# Patient Record
Sex: Female | Born: 1970
Health system: Southern US, Community
[De-identification: ages and names within clinical notes are randomized; demographics above are authoritative.]

## PROBLEM LIST (undated history)

## (undated) DIAGNOSIS — F419 Anxiety disorder, unspecified: Secondary | ICD-10-CM

## (undated) DIAGNOSIS — D6852 Prothrombin gene mutation: Secondary | ICD-10-CM

## (undated) DIAGNOSIS — S83519A Sprain of anterior cruciate ligament of unspecified knee, initial encounter: Secondary | ICD-10-CM

## (undated) DIAGNOSIS — I517 Cardiomegaly: Secondary | ICD-10-CM

## (undated) DIAGNOSIS — D759 Disease of blood and blood-forming organs, unspecified: Secondary | ICD-10-CM

## (undated) DIAGNOSIS — I10 Essential (primary) hypertension: Secondary | ICD-10-CM

## (undated) DIAGNOSIS — E042 Nontoxic multinodular goiter: Secondary | ICD-10-CM

## (undated) DIAGNOSIS — F32A Depression, unspecified: Secondary | ICD-10-CM

## (undated) DIAGNOSIS — G43909 Migraine, unspecified, not intractable, without status migrainosus: Secondary | ICD-10-CM

## (undated) DIAGNOSIS — R0600 Dyspnea, unspecified: Secondary | ICD-10-CM

## (undated) DIAGNOSIS — M545 Low back pain, unspecified: Secondary | ICD-10-CM

## (undated) DIAGNOSIS — M47816 Spondylosis without myelopathy or radiculopathy, lumbar region: Secondary | ICD-10-CM

## (undated) DIAGNOSIS — I2699 Other pulmonary embolism without acute cor pulmonale: Secondary | ICD-10-CM

## (undated) DIAGNOSIS — G4700 Insomnia, unspecified: Secondary | ICD-10-CM

## (undated) DIAGNOSIS — D6851 Activated protein C resistance: Secondary | ICD-10-CM

## (undated) DIAGNOSIS — S14109A Unspecified injury at unspecified level of cervical spinal cord, initial encounter: Secondary | ICD-10-CM

## (undated) DIAGNOSIS — R51 Headache: Secondary | ICD-10-CM

## (undated) DIAGNOSIS — J45909 Unspecified asthma, uncomplicated: Secondary | ICD-10-CM

## (undated) DIAGNOSIS — R296 Repeated falls: Secondary | ICD-10-CM

## (undated) DIAGNOSIS — I82409 Acute embolism and thrombosis of unspecified deep veins of unspecified lower extremity: Secondary | ICD-10-CM

## (undated) DIAGNOSIS — R202 Paresthesia of skin: Secondary | ICD-10-CM

## (undated) DIAGNOSIS — M791 Myalgia, unspecified site: Secondary | ICD-10-CM

## (undated) DIAGNOSIS — J449 Chronic obstructive pulmonary disease, unspecified: Secondary | ICD-10-CM

## (undated) DIAGNOSIS — G950 Syringomyelia and syringobulbia: Secondary | ICD-10-CM

## (undated) DIAGNOSIS — N3010 Interstitial cystitis (chronic) without hematuria: Secondary | ICD-10-CM

## (undated) DIAGNOSIS — F329 Major depressive disorder, single episode, unspecified: Secondary | ICD-10-CM

## (undated) DIAGNOSIS — S83419A Sprain of medial collateral ligament of unspecified knee, initial encounter: Secondary | ICD-10-CM

## (undated) DIAGNOSIS — R200 Anesthesia of skin: Secondary | ICD-10-CM

## (undated) DIAGNOSIS — M797 Fibromyalgia: Secondary | ICD-10-CM

## (undated) DIAGNOSIS — R519 Headache, unspecified: Secondary | ICD-10-CM

## (undated) HISTORY — DX: Unspecified injury at unspecified level of cervical spinal cord, initial encounter: S14.109A

## (undated) HISTORY — DX: Essential (primary) hypertension: I10

## (undated) HISTORY — DX: Other pulmonary embolism without acute cor pulmonale: I26.99

## (undated) HISTORY — DX: Low back pain: M54.5

## (undated) HISTORY — PX: CERVICAL SPINE SURGERY: SHX589

## (undated) HISTORY — DX: Acute embolism and thrombosis of unspecified deep veins of unspecified lower extremity: I82.409

## (undated) HISTORY — DX: Insomnia, unspecified: G47.00

## (undated) HISTORY — DX: Myalgia, unspecified site: M79.10

## (undated) HISTORY — DX: Low back pain, unspecified: M54.50

## (undated) HISTORY — DX: Cardiomegaly: I51.7

## (undated) HISTORY — PX: CHOLECYSTECTOMY: SHX55

## (undated) HISTORY — DX: Chronic obstructive pulmonary disease, unspecified: J44.9

## (undated) HISTORY — DX: Prothrombin gene mutation: D68.52

## (undated) HISTORY — DX: Anxiety disorder, unspecified: F41.9

---

## 1998-05-09 ENCOUNTER — Ambulatory Visit (HOSPITAL_COMMUNITY): Admission: RE | Admit: 1998-05-09 | Discharge: 1998-05-09 | Payer: Self-pay | Admitting: Urology

## 1998-07-18 ENCOUNTER — Inpatient Hospital Stay (HOSPITAL_COMMUNITY): Admission: AD | Admit: 1998-07-18 | Discharge: 1998-07-21 | Payer: Self-pay | Admitting: Emergency Medicine

## 1998-08-14 ENCOUNTER — Ambulatory Visit (HOSPITAL_COMMUNITY): Admission: RE | Admit: 1998-08-14 | Discharge: 1998-08-14 | Payer: Self-pay | Admitting: Gastroenterology

## 1998-08-22 ENCOUNTER — Ambulatory Visit (HOSPITAL_COMMUNITY): Admission: RE | Admit: 1998-08-22 | Discharge: 1998-08-22 | Payer: Self-pay | Admitting: Gastroenterology

## 1998-08-22 ENCOUNTER — Encounter: Payer: Self-pay | Admitting: Gastroenterology

## 2013-07-13 DIAGNOSIS — S83419A Sprain of medial collateral ligament of unspecified knee, initial encounter: Secondary | ICD-10-CM

## 2013-07-13 DIAGNOSIS — S83519A Sprain of anterior cruciate ligament of unspecified knee, initial encounter: Secondary | ICD-10-CM

## 2013-07-13 HISTORY — DX: Sprain of medial collateral ligament of unspecified knee, initial encounter: S83.419A

## 2013-07-13 HISTORY — DX: Sprain of anterior cruciate ligament of unspecified knee, initial encounter: S83.519A

## 2014-01-17 DIAGNOSIS — S83519A Sprain of anterior cruciate ligament of unspecified knee, initial encounter: Secondary | ICD-10-CM | POA: Insufficient documentation

## 2014-01-17 DIAGNOSIS — S83419A Sprain of medial collateral ligament of unspecified knee, initial encounter: Secondary | ICD-10-CM | POA: Insufficient documentation

## 2014-05-09 DIAGNOSIS — S83421A Sprain of lateral collateral ligament of right knee, initial encounter: Secondary | ICD-10-CM | POA: Insufficient documentation

## 2014-06-06 ENCOUNTER — Ambulatory Visit (INDEPENDENT_AMBULATORY_CARE_PROVIDER_SITE_OTHER): Payer: BC Managed Care – HMO | Admitting: Diagnostic Neuroimaging

## 2014-06-06 ENCOUNTER — Encounter: Payer: Self-pay | Admitting: Diagnostic Neuroimaging

## 2014-06-06 VITALS — BP 117/88 | HR 82 | Temp 98.2°F | Ht 64.0 in | Wt 264.5 lb

## 2014-06-06 DIAGNOSIS — G950 Syringomyelia and syringobulbia: Secondary | ICD-10-CM
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
## 2014-06-10 ENCOUNTER — Encounter: Payer: Self-pay | Admitting: Diagnostic Neuroimaging

## 2014-07-20 ENCOUNTER — Encounter: Payer: Self-pay | Admitting: Diagnostic Neuroimaging

## 2014-09-28 DIAGNOSIS — G950 Syringomyelia and syringobulbia: Secondary | ICD-10-CM | POA: Insufficient documentation

## 2014-09-28 DIAGNOSIS — M62838 Other muscle spasm: Secondary | ICD-10-CM | POA: Insufficient documentation

## 2014-09-28 DIAGNOSIS — R569 Unspecified convulsions: Secondary | ICD-10-CM | POA: Insufficient documentation

## 2014-11-29 DIAGNOSIS — R202 Paresthesia of skin: Secondary | ICD-10-CM | POA: Insufficient documentation

## 2014-11-29 DIAGNOSIS — R200 Anesthesia of skin: Secondary | ICD-10-CM | POA: Insufficient documentation

## 2014-12-05 ENCOUNTER — Ambulatory Visit: Payer: BC Managed Care – HMO | Admitting: Diagnostic Neuroimaging

## 2015-05-02 ENCOUNTER — Other Ambulatory Visit: Payer: Self-pay | Admitting: Gastroenterology

## 2015-05-02 DIAGNOSIS — R110 Nausea: Secondary | ICD-10-CM

## 2015-05-02 DIAGNOSIS — R1011 Right upper quadrant pain: Secondary | ICD-10-CM

## 2015-05-07 ENCOUNTER — Ambulatory Visit (HOSPITAL_COMMUNITY): Admission: RE | Admit: 2015-05-07 | Payer: BLUE CROSS/BLUE SHIELD | Source: Ambulatory Visit

## 2015-05-08 ENCOUNTER — Ambulatory Visit: Payer: Self-pay | Admitting: Gastroenterology

## 2015-05-09 ENCOUNTER — Ambulatory Visit (HOSPITAL_COMMUNITY): Payer: BLUE CROSS/BLUE SHIELD

## 2015-10-04 ENCOUNTER — Ambulatory Visit (INDEPENDENT_AMBULATORY_CARE_PROVIDER_SITE_OTHER): Payer: BLUE CROSS/BLUE SHIELD | Admitting: Obstetrics & Gynecology

## 2015-10-04 ENCOUNTER — Encounter: Payer: Self-pay | Admitting: Obstetrics & Gynecology

## 2015-10-04 VITALS — BP 136/85 | HR 85 | Temp 98.7°F | Wt 264.0 lb

## 2015-10-04 DIAGNOSIS — N938 Other specified abnormal uterine and vaginal bleeding: Secondary | ICD-10-CM | POA: Insufficient documentation

## 2015-10-04 LAB — CBC
HCT: 37.3 % (ref 36.0–46.0)
Hemoglobin: 12.3 g/dL (ref 12.0–15.0)
MCH: 28.9 pg (ref 26.0–34.0)
MCHC: 33 g/dL (ref 30.0–36.0)
MCV: 87.6 fL (ref 78.0–100.0)
MPV: 9.8 fL (ref 8.6–12.4)
PLATELETS: 408 10*3/uL — AB (ref 150–400)
RBC: 4.26 MIL/uL (ref 3.87–5.11)
RDW: 14.4 % (ref 11.5–15.5)
WBC: 8.9 10*3/uL (ref 4.0–10.5)

## 2015-10-04 MED ORDER — MEGESTROL ACETATE 40 MG PO TABS: 80.0000 mg | ORAL_TABLET | Freq: Two times a day (BID) | ORAL | Status: DC

## 2015-10-04 NOTE — Patient Instructions (Signed)

## 2015-10-04 NOTE — Progress Notes (Signed)
Patient ID: Carrie Joyce, female   DOB: 08-04-1970, 45 y.o.   MRN: [id number]  Chief Complaint  Patient presents with  . New Patient (Initial Visit)    Abnormal Bleeding     HPI Carrie Joyce is a 45 y.o. female.  G1P! No LMP recorded. Daily vaginal bleeding since September 2016 unresponsive to Provera challenge and OCP daily, occasionally heavy. She had an endometrial biopsy at her family Dr and [place] was done 12/16 no records available to review today.   HPI  Past Medical History  Diagnosis Date  . Insomnia   . Anxiety   . COPD (chronic obstructive pulmonary disease) (HCC)   . Cervical spinal cord injury (HCC)   . Essential hypertension   . Low back pain   . Myalgia     History reviewed. No pertinent past surgical history.  Family History  Problem Relation Age of Onset  . Hypertension Mother   . Fibromyalgia Mother   . SIDS Brother     Social History Social History  Substance Use Topics  . Smoking status: Never Smoker   . Smokeless tobacco: None  . Alcohol Use: 0.0 oz/week    0 Standard drinks or equivalent per week     Comment: Occasional    Allergies  Allergen Reactions  . Codeine Nausea And Vomiting and Nausea Only  . Penicillins Hives  . Prochlorperazine Itching  . Sulfa Antibiotics Hives and Swelling    Current Outpatient Prescriptions  Medication Sig Dispense Refill  . ALPRAZolam (XANAX) 1 MG tablet Take 1 tablet by mouth at bedtime.  0  . budesonide-formoterol (SYMBICORT) 160-4.5 MCG/ACT inhaler Inhale 1 puff into the lungs 2 (two) times daily.    . Calcium Carbonate-Vit D-Min (CALCIUM 600+D PLUS MINERALS) 600-400 MG-UNIT TABS Take 1 tablet by mouth daily.    . diazepam (VALIUM) 5 MG tablet Take 1 tablet by mouth every 8 (eight) hours.  0  . DULoxetine (CYMBALTA) 60 MG capsule Take 2 capsules by mouth every morning.  1  . gabapentin (NEURONTIN) 800 MG tablet Take 1 tablet by mouth 2 (two) times daily.  1  . lisinopril (PRINIVIL,ZESTRIL) 10 MG  tablet Take 1 tablet by mouth daily.  4  . magnesium oxide (MAG-OX) 400 MG tablet Take 1 tablet by mouth at bedtime.    . Multiple Vitamin (THERA) TABS Take 1 tablet by mouth daily.    . traMADol (ULTRAM) 50 MG tablet Take 1 tablet by mouth as needed.  0  . baclofen (LIORESAL) 10 MG tablet Take 1 tablet by mouth as needed. Reported on 10/04/2015  0  . megestrol (MEGACE) 40 MG tablet Take 2 tablets (80 mg total) by mouth 2 (two) times daily. When bleeding stops decrease to one tablet twice daily 90 tablet 3   No current facility-administered medications for this visit.    Review of Systems Review of Systems  Gastrointestinal: Positive for abdominal pain.  Genitourinary: Positive for vaginal bleeding, menstrual problem and pelvic pain.    Blood pressure 136/85, pulse 85, temperature 98.7 F (37.1 C), weight 264 lb (119.75 kg).  Physical Exam Physical Exam  Constitutional: She is oriented to person, place, and time. She appears well-developed. No distress.  Uses a walker  HENT:  Head: Normocephalic.  Abdominal: Soft.  Genitourinary: Uterus normal. Vaginal discharge (dark blood in vault) found.  No masses or tenderness  Neurological: She is alert and oriented to person, place, and time.  Skin: No pallor.  Psychiatric: She has a  normal mood and affect. Her behavior is normal.  Vitals reviewed.   Data Reviewed   Assessment    DUB unresponsive to OCP, states no Dx of fibroid uterus.  Need record from her providers     Plan    CBC today Change to Megace 80 mg BID until no bleeding then 40 BID Record request RTC 4 weeks        Aastha Dayley 10/04/2015, 11:13 AM

## 2015-10-07 ENCOUNTER — Telehealth: Payer: Self-pay | Admitting: *Deleted

## 2015-10-07 NOTE — Telephone Encounter (Signed)
Called Carrie Joyce and informed her that per Dr. Debroah LoopArnold her hemoglobin was normal and she does not have anemia.  Carrie Joyce voiced understanding and had no questions.

## 2015-11-04 ENCOUNTER — Encounter: Payer: Self-pay | Admitting: Obstetrics & Gynecology

## 2015-11-04 ENCOUNTER — Ambulatory Visit (INDEPENDENT_AMBULATORY_CARE_PROVIDER_SITE_OTHER): Payer: BLUE CROSS/BLUE SHIELD | Admitting: Obstetrics & Gynecology

## 2015-11-04 VITALS — BP 139/99 | HR 110 | Wt 259.2 lb

## 2015-11-04 DIAGNOSIS — N938 Other specified abnormal uterine and vaginal bleeding: Secondary | ICD-10-CM | POA: Diagnosis not present

## 2015-11-04 NOTE — Patient Instructions (Signed)
Levonorgestrel intrauterine device (IUD) What is this medicine? LEVONORGESTREL IUD (LEE voe nor jes trel) is a contraceptive (birth control) device. The device is placed inside the uterus by a healthcare professional. It is used to prevent pregnancy and can also be used to treat heavy bleeding that occurs during your period. Depending on the device, it can be used for 3 to 5 years. This medicine may be used for other purposes; ask your health care provider or pharmacist if you have questions. What should I tell my health care provider before I take this medicine? They need to know if you have any of these conditions: -abnormal Pap smear -cancer of the breast, uterus, or cervix -diabetes -endometritis -genital or pelvic infection now or in the past -have more than one sexual partner or your partner has more than one partner -heart disease -history of an ectopic or tubal pregnancy -immune system problems -IUD in place -liver disease or tumor -problems with blood clots or take blood-thinners -use intravenous drugs -uterus of unusual shape -vaginal bleeding that has not been explained -an unusual or allergic reaction to levonorgestrel, other hormones, silicone, or polyethylene, medicines, foods, dyes, or preservatives -pregnant or trying to get pregnant -breast-feeding How should I use this medicine? This device is placed inside the uterus by a health care professional. Talk to your pediatrician regarding the use of this medicine in children. Special care may be needed. Overdosage: If you think you have taken too much of this medicine contact a poison control center or emergency room at once. NOTE: This medicine is only for you. Do not share this medicine with others. What if I miss a dose? This does not apply. What may interact with this medicine? Do not take this medicine with any of the following medications: -amprenavir -bosentan -fosamprenavir This medicine may also interact with  the following medications: -aprepitant -barbiturate medicines for inducing sleep or treating seizures -bexarotene -griseofulvin -medicines to treat seizures like carbamazepine, ethotoin, felbamate, oxcarbazepine, phenytoin, topiramate -modafinil -pioglitazone -rifabutin -rifampin -rifapentine -some medicines to treat HIV infection like atazanavir, indinavir, lopinavir, nelfinavir, tipranavir, ritonavir -St. John's wort -warfarin This list may not describe all possible interactions. Give your health care provider a list of all the medicines, herbs, non-prescription drugs, or dietary supplements you use. Also tell them if you smoke, drink alcohol, or use illegal drugs. Some items may interact with your medicine. What should I watch for while using this medicine? Visit your doctor or health care professional for regular check ups. See your doctor if you or your partner has sexual contact with others, becomes HIV positive, or gets a sexual transmitted disease. This product does not protect you against HIV infection (AIDS) or other sexually transmitted diseases. You can check the placement of the IUD yourself by reaching up to the top of your vagina with clean fingers to feel the threads. Do not pull on the threads. It is a good habit to check placement after each menstrual period. Call your doctor right away if you feel more of the IUD than just the threads or if you cannot feel the threads at all. The IUD may come out by itself. You may become pregnant if the device comes out. If you notice that the IUD has come out use a backup birth control method like condoms and call your health care provider. Using tampons will not change the position of the IUD and are okay to use during your period. What side effects may I notice from receiving this medicine?   Side effects that you should report to your doctor or health care professional as soon as possible: -allergic reactions like skin rash, itching or  hives, swelling of the face, lips, or tongue -fever, flu-like symptoms -genital sores -high blood pressure -no menstrual period for 6 weeks during use -pain, swelling, warmth in the leg -pelvic pain or tenderness -severe or sudden headache -signs of pregnancy -stomach cramping -sudden shortness of breath -trouble with balance, talking, or walking -unusual vaginal bleeding, discharge -yellowing of the eyes or skin Side effects that usually do not require medical attention (report to your doctor or health care professional if they continue or are bothersome): -acne -breast pain -change in sex drive or performance -changes in weight -cramping, dizziness, or faintness while the device is being inserted -headache -irregular menstrual bleeding within first 3 to 6 months of use -nausea This list may not describe all possible side effects. Call your doctor for medical advice about side effects. You may report side effects to FDA at 1-800-FDA-1088. Where should I keep my medicine? This does not apply. NOTE: This sheet is a summary. It may not cover all possible information. If you have questions about this medicine, talk to your doctor, pharmacist, or health care provider.    2016, [employer]vier/Gold Standard. (2011-07-30 13:54:04)  

## 2015-11-04 NOTE — Progress Notes (Signed)
Subjective:     Patient ID: Carrie Joyce, female   DOB: 06/18/1971, 45 y.o.   MRN: [id number]  HPI Danilyn Prescilla SoursRoop Birchall is a 45 y.o. female G1P1 seen today for f/u for DUB.  She had been having daily vaginal bleeding beginning in 03/2015 that was unresponsive to provera challenge and daily OCP, and was put on megace BID one month ago.  She reports that with the megace twice daily, her bleeding is decreased to minimal spotting. Her "ovarian pain" has decreased from an 8/10 to 4/10 with the medication.  Patient reports that she did a trial of decreasing the medication to once daily, but that she continued to have bleeding.  Patient denies any other new symptoms or complaints.  She endorses wanting a more long-term solution for her vaginal bleeding, and is interested in having a levonorgestrel-containing IUD.  She reports having an IUD about 20 years ago and denies any complications with it.  We are still waiting to receive records from her PCP for an endometrial biopsy and US done 12/16.  Patient says that she will go to the office for the records herself.  Review of Systems  Genitourinary: Positive for pelvic pain.       Minimal spotting  Musculoskeletal: Positive for back pain.       Objective:   Physical Exam  Constitutional: She is oriented to person, place, and time. She appears well-developed and well-nourished. No distress.  HENT:  Head: Normocephalic.  Neurological: She is alert and oriented to person, place, and time.  Skin: Skin is warm and dry. No pallor.  Psychiatric: She has a normal mood and affect. Her behavior is normal.       Assessment:    DUB well-controlled with megace 40 mg BID; desires switch to levonorgestrel-containing IUD     Plan:     Continue megace 40 mg BID Appt for IUD insertion Record request from PCP

## 2015-12-02 ENCOUNTER — Ambulatory Visit: Payer: BLUE CROSS/BLUE SHIELD | Admitting: Obstetrics & Gynecology

## 2015-12-31 ENCOUNTER — Other Ambulatory Visit: Payer: Self-pay | Admitting: Obstetrics & Gynecology

## 2016-06-15 ENCOUNTER — Inpatient Hospital Stay (HOSPITAL_COMMUNITY)
Admission: AD | Admit: 2016-06-15 | Discharge: 2016-06-18 | DRG: 175 | Disposition: A | Discharge status: Home / Self Care | Payer: BLUE CROSS/BLUE SHIELD | Source: Other Acute Inpatient Hospital | Attending: Internal Medicine | Admitting: Internal Medicine

## 2016-06-15 ENCOUNTER — Inpatient Hospital Stay (HOSPITAL_COMMUNITY): Payer: BLUE CROSS/BLUE SHIELD

## 2016-06-15 ENCOUNTER — Encounter (HOSPITAL_COMMUNITY): Payer: Self-pay | Admitting: *Deleted

## 2016-06-15 DIAGNOSIS — J9621 Acute and chronic respiratory failure with hypoxia: Secondary | ICD-10-CM | POA: Diagnosis not present

## 2016-06-15 DIAGNOSIS — R778 Other specified abnormalities of plasma proteins: Secondary | ICD-10-CM | POA: Diagnosis present

## 2016-06-15 DIAGNOSIS — K589 Irritable bowel syndrome without diarrhea: Secondary | ICD-10-CM | POA: Diagnosis present

## 2016-06-15 DIAGNOSIS — Z7951 Long term (current) use of inhaled steroids: Secondary | ICD-10-CM | POA: Diagnosis not present

## 2016-06-15 DIAGNOSIS — D649 Anemia, unspecified: Secondary | ICD-10-CM | POA: Diagnosis present

## 2016-06-15 DIAGNOSIS — E669 Obesity, unspecified: Secondary | ICD-10-CM | POA: Diagnosis present

## 2016-06-15 DIAGNOSIS — I2609 Other pulmonary embolism with acute cor pulmonale: Principal | ICD-10-CM | POA: Diagnosis present

## 2016-06-15 DIAGNOSIS — F419 Anxiety disorder, unspecified: Secondary | ICD-10-CM | POA: Diagnosis present

## 2016-06-15 DIAGNOSIS — J449 Chronic obstructive pulmonary disease, unspecified: Secondary | ICD-10-CM | POA: Diagnosis present

## 2016-06-15 DIAGNOSIS — I2699 Other pulmonary embolism without acute cor pulmonale: Secondary | ICD-10-CM

## 2016-06-15 DIAGNOSIS — E876 Hypokalemia: Secondary | ICD-10-CM | POA: Diagnosis present

## 2016-06-15 DIAGNOSIS — Z6841 Body Mass Index (BMI) 40.0 and over, adult: Secondary | ICD-10-CM

## 2016-06-15 DIAGNOSIS — J9601 Acute respiratory failure with hypoxia: Secondary | ICD-10-CM | POA: Diagnosis present

## 2016-06-15 DIAGNOSIS — I2601 Septic pulmonary embolism with acute cor pulmonale: Secondary | ICD-10-CM | POA: Diagnosis not present

## 2016-06-15 DIAGNOSIS — N938 Other specified abnormal uterine and vaginal bleeding: Secondary | ICD-10-CM | POA: Diagnosis present

## 2016-06-15 DIAGNOSIS — M545 Low back pain: Secondary | ICD-10-CM | POA: Diagnosis present

## 2016-06-15 DIAGNOSIS — I2602 Saddle embolus of pulmonary artery with acute cor pulmonale: Secondary | ICD-10-CM | POA: Diagnosis not present

## 2016-06-15 DIAGNOSIS — M791 Myalgia: Secondary | ICD-10-CM | POA: Diagnosis present

## 2016-06-15 DIAGNOSIS — R55 Syncope and collapse: Secondary | ICD-10-CM | POA: Diagnosis present

## 2016-06-15 DIAGNOSIS — K5790 Diverticulosis of intestine, part unspecified, without perforation or abscess without bleeding: Secondary | ICD-10-CM | POA: Diagnosis present

## 2016-06-15 DIAGNOSIS — G4700 Insomnia, unspecified: Secondary | ICD-10-CM | POA: Diagnosis present

## 2016-06-15 DIAGNOSIS — Z79899 Other long term (current) drug therapy: Secondary | ICD-10-CM | POA: Diagnosis not present

## 2016-06-15 DIAGNOSIS — I82401 Acute embolism and thrombosis of unspecified deep veins of right lower extremity: Secondary | ICD-10-CM | POA: Diagnosis present

## 2016-06-15 DIAGNOSIS — Z87828 Personal history of other (healed) physical injury and trauma: Secondary | ICD-10-CM

## 2016-06-15 DIAGNOSIS — D72829 Elevated white blood cell count, unspecified: Secondary | ICD-10-CM | POA: Diagnosis present

## 2016-06-15 DIAGNOSIS — G8929 Other chronic pain: Secondary | ICD-10-CM | POA: Diagnosis present

## 2016-06-15 DIAGNOSIS — K649 Unspecified hemorrhoids: Secondary | ICD-10-CM | POA: Diagnosis present

## 2016-06-15 DIAGNOSIS — I2692 Saddle embolus of pulmonary artery without acute cor pulmonale: Secondary | ICD-10-CM

## 2016-06-15 DIAGNOSIS — F329 Major depressive disorder, single episode, unspecified: Secondary | ICD-10-CM | POA: Diagnosis present

## 2016-06-15 DIAGNOSIS — G950 Syringomyelia and syringobulbia: Secondary | ICD-10-CM | POA: Diagnosis present

## 2016-06-15 DIAGNOSIS — D6489 Other specified anemias: Secondary | ICD-10-CM | POA: Diagnosis not present

## 2016-06-15 DIAGNOSIS — I10 Essential (primary) hypertension: Secondary | ICD-10-CM | POA: Diagnosis present

## 2016-06-15 DIAGNOSIS — R739 Hyperglycemia, unspecified: Secondary | ICD-10-CM | POA: Diagnosis present

## 2016-06-15 HISTORY — DX: Depression, unspecified: F32.A

## 2016-06-15 HISTORY — DX: Major depressive disorder, single episode, unspecified: F32.9

## 2016-06-15 HISTORY — DX: Unspecified asthma, uncomplicated: J45.909

## 2016-06-15 HISTORY — PX: IR GENERIC HISTORICAL: IMG1180011

## 2016-06-15 LAB — ECHOCARDIOGRAM COMPLETE
Height: 64 in
Weight: 4254 oz

## 2016-06-15 LAB — CBC
HCT: 38.1 % (ref 36.0–46.0)
HCT: 37.3 % (ref 36.0–46.0)
HEMOGLOBIN: 12.5 g/dL (ref 12.0–15.0)
HEMOGLOBIN: 12.1 g/dL (ref 12.0–15.0)
MCH: 28.6 pg (ref 26.0–34.0)
MCH: 28.4 pg (ref 26.0–34.0)
MCHC: 32.8 g/dL (ref 30.0–36.0)
MCHC: 32.4 g/dL (ref 30.0–36.0)
MCV: 87.2 fL (ref 78.0–100.0)
MCV: 87.6 fL (ref 78.0–100.0)
PLATELETS: 206 10*3/uL (ref 150–400)
PLATELETS: 183 10*3/uL (ref 150–400)
RBC: 4.37 MIL/uL (ref 3.87–5.11)
RBC: 4.26 MIL/uL (ref 3.87–5.11)
RDW: 14 % (ref 11.5–15.5)
RDW: 14.1 % (ref 11.5–15.5)
WBC: 11.4 10*3/uL — ABNORMAL HIGH (ref 4.0–10.5)
WBC: 10.2 10*3/uL (ref 4.0–10.5)

## 2016-06-15 LAB — BASIC METABOLIC PANEL
Anion gap: 7 (ref 5–15)
BUN: 7 mg/dL (ref 6–20)
CHLORIDE: 108 mmol/L (ref 101–111)
CO2: 20 mmol/L — ABNORMAL LOW (ref 22–32)
CREATININE: 1.18 mg/dL — AB (ref 0.44–1.00)
Calcium: 8.3 mg/dL — ABNORMAL LOW (ref 8.9–10.3)
GFR calc Af Amer: >60 mL/min (ref >60)
GFR calc non Af Amer: 55 mL/min — ABNORMAL LOW (ref >60)
GLUCOSE: 141 mg/dL — AB (ref 65–99)
Potassium: 4.5 mmol/L (ref 3.5–5.1)
SODIUM: 135 mmol/L (ref 135–145)

## 2016-06-15 LAB — ABO/RH: ABO/RH(D): O POS

## 2016-06-15 LAB — TROPONIN I
TROPONIN I: 0.98 ng/mL — AB (ref <0.03)
TROPONIN I: 0.59 ng/mL — AB (ref <0.03)
Troponin I: 1.28 ng/mL (ref <0.03)

## 2016-06-15 LAB — URINE MICROSCOPIC-ADD ON

## 2016-06-15 LAB — URINALYSIS, ROUTINE W REFLEX MICROSCOPIC
BILIRUBIN URINE: NEGATIVE
GLUCOSE, UA: NEGATIVE mg/dL
KETONES UR: NEGATIVE mg/dL
Leukocytes, UA: NEGATIVE
Nitrite: NEGATIVE
PH: 5 (ref 5.0–8.0)
Protein, ur: NEGATIVE mg/dL
Specific Gravity, Urine: 1.02 (ref 1.005–1.030)

## 2016-06-15 LAB — HEPARIN LEVEL (UNFRACTIONATED)
HEPARIN UNFRACTIONATED: 0.79 [IU]/mL — AB (ref 0.30–0.70)
HEPARIN UNFRACTIONATED: 0.54 [IU]/mL (ref 0.30–0.70)
HEPARIN UNFRACTIONATED: 0.33 [IU]/mL (ref 0.30–0.70)
Heparin Unfractionated: 0.68 IU/mL (ref 0.30–0.70)

## 2016-06-15 LAB — BRAIN NATRIURETIC PEPTIDE: B Natriuretic Peptide: 60 pg/mL (ref 0.0–100.0)

## 2016-06-15 LAB — TYPE AND SCREEN
ABO/RH(D): O POS
ANTIBODY SCREEN: NEGATIVE

## 2016-06-15 LAB — GLUCOSE, CAPILLARY
GLUCOSE-CAPILLARY: 142 mg/dL — AB (ref 65–99)
GLUCOSE-CAPILLARY: 97 mg/dL (ref 65–99)

## 2016-06-15 LAB — MAGNESIUM: Magnesium: 2.2 mg/dL (ref 1.7–2.4)

## 2016-06-15 LAB — MRSA PCR SCREENING: MRSA by PCR: NEGATIVE

## 2016-06-15 LAB — FIBRINOGEN: FIBRINOGEN: 428 mg/dL (ref 210–475)

## 2016-06-15 LAB — PHOSPHORUS: PHOSPHORUS: 3.8 mg/dL (ref 2.5–4.6)

## 2016-06-15 MED ORDER — SODIUM CHLORIDE 0.9 % IV SOLN
INTRAVENOUS | Status: DC
  Administered 2016-06-16: 10 mL/h via INTRAVENOUS

## 2016-06-15 MED ORDER — ORAL CARE MOUTH RINSE
15.0000 mL | Freq: Two times a day (BID) | OROMUCOSAL | Status: DC
  Administered 2016-06-15 – 2016-06-18 (×6): 15 mL via OROMUCOSAL

## 2016-06-15 MED ORDER — MIDAZOLAM HCL 2 MG/2ML IJ SOLN
INTRAMUSCULAR | Status: AC | PRN
  Administered 2016-06-15 (×2): 1 mg via INTRAVENOUS

## 2016-06-15 MED ORDER — WHITE PETROLATUM GEL
Status: AC
  Administered 2016-06-15: 07:00:00
  Filled 2016-06-15: qty 1

## 2016-06-15 MED ORDER — TOPIRAMATE 100 MG PO TABS
100.0000 mg | ORAL_TABLET | Freq: Every day | ORAL | Status: DC
  Administered 2016-06-15 – 2016-06-17 (×3): 100 mg via ORAL
  Filled 2016-06-15 (×4): qty 1

## 2016-06-15 MED ORDER — IPRATROPIUM-ALBUTEROL 0.5-2.5 (3) MG/3ML IN SOLN
3.0000 mL | Freq: Four times a day (QID) | RESPIRATORY_TRACT | Status: DC | PRN
  Administered 2016-06-15: 3 mL via RESPIRATORY_TRACT
  Filled 2016-06-15: qty 3

## 2016-06-15 MED ORDER — IOPAMIDOL (ISOVUE-300) INJECTION 61%: 50.0000 mL | Freq: Once | INTRAVENOUS | Status: DC | PRN

## 2016-06-15 MED ORDER — ALPRAZOLAM 0.5 MG PO TABS
1.0000 mg | ORAL_TABLET | Freq: Every evening | ORAL | Status: DC | PRN
  Administered 2016-06-15 – 2016-06-17 (×2): 1 mg via ORAL
  Filled 2016-06-15 (×2): qty 2

## 2016-06-15 MED ORDER — HEPARIN (PORCINE) IN NACL 100-0.45 UNIT/ML-% IJ SOLN
1350.0000 [IU]/h | INTRAMUSCULAR | Status: DC
  Administered 2016-06-15: 1250 [IU]/h via INTRAVENOUS
  Administered 2016-06-15: 1350 [IU]/h via INTRAVENOUS
  Administered 2016-06-16 – 2016-06-17 (×2): 1250 [IU]/h via INTRAVENOUS
  Administered 2016-06-18: 1350 [IU]/h via INTRAVENOUS
  Filled 2016-06-15 (×8): qty 250

## 2016-06-15 MED ORDER — MIDAZOLAM HCL 2 MG/2ML IJ SOLN
INTRAMUSCULAR | Status: AC
  Filled 2016-06-15: qty 2

## 2016-06-15 MED ORDER — ALTEPLASE 50 MG IV SOLR
12.0000 mg | Freq: Once | INTRAVENOUS | Status: DC
  Filled 2016-06-15: qty 12

## 2016-06-15 MED ORDER — ONDANSETRON HCL 4 MG/2ML IJ SOLN
4.0000 mg | Freq: Four times a day (QID) | INTRAMUSCULAR | Status: DC | PRN
  Administered 2016-06-15 – 2016-06-17 (×2): 4 mg via INTRAVENOUS
  Filled 2016-06-15 (×2): qty 2

## 2016-06-15 MED ORDER — FENTANYL CITRATE (PF) 100 MCG/2ML IJ SOLN
INTRAMUSCULAR | Status: AC | PRN
  Administered 2016-06-15 (×2): 50 ug via INTRAVENOUS

## 2016-06-15 MED ORDER — SODIUM CHLORIDE 0.9 % IV SOLN
8.0000 mg/h | INTRAVENOUS | Status: DC
  Administered 2016-06-15 – 2016-06-17 (×5): 8 mg/h via INTRAVENOUS
  Filled 2016-06-15 (×10): qty 80

## 2016-06-15 MED ORDER — ALPRAZOLAM 0.5 MG PO TABS: 1.0000 mg | ORAL_TABLET | Freq: Every day | ORAL | Status: DC

## 2016-06-15 MED ORDER — GABAPENTIN 400 MG PO CAPS
800.0000 mg | ORAL_CAPSULE | Freq: Two times a day (BID) | ORAL | Status: DC
  Administered 2016-06-15 – 2016-06-18 (×7): 800 mg via ORAL
  Filled 2016-06-15 (×10): qty 2

## 2016-06-15 MED ORDER — LIDOCAINE HCL 1 % IJ SOLN
INTRAMUSCULAR | Status: AC | PRN
  Administered 2016-06-15: 15 mL

## 2016-06-15 MED ORDER — SODIUM CHLORIDE 0.9 % IV SOLN: 250.0000 mL | INTRAVENOUS | Status: DC | PRN

## 2016-06-15 MED ORDER — DULOXETINE HCL 60 MG PO CPEP
120.0000 mg | ORAL_CAPSULE | Freq: Every day | ORAL | Status: DC
  Administered 2016-06-15 – 2016-06-18 (×4): 120 mg via ORAL
  Filled 2016-06-15 (×4): qty 2

## 2016-06-15 MED ORDER — LIDOCAINE HCL 1 % IJ SOLN
INTRAMUSCULAR | Status: AC
  Filled 2016-06-15: qty 20

## 2016-06-15 MED ORDER — SODIUM CHLORIDE 0.9 % IV SOLN
250.0000 mL | INTRAVENOUS | Status: DC | PRN
  Administered 2016-06-15: 250 mL via INTRAVENOUS

## 2016-06-15 MED ORDER — SODIUM CHLORIDE 0.9 % IV SOLN
INTRAVENOUS | Status: DC
  Administered 2016-06-15: 21:00:00 via INTRAVENOUS

## 2016-06-15 MED ORDER — SODIUM CHLORIDE 0.9% FLUSH
3.0000 mL | INTRAVENOUS | Status: DC | PRN
Start: 2016-06-15 — End: 2016-06-17

## 2016-06-15 MED ORDER — HYDROXYZINE HCL 25 MG PO TABS
25.0000 mg | ORAL_TABLET | Freq: Every day | ORAL | Status: DC
  Administered 2016-06-15 – 2016-06-17 (×3): 25 mg via ORAL
  Filled 2016-06-15 (×4): qty 1

## 2016-06-15 MED ORDER — SODIUM CHLORIDE 0.9% FLUSH
3.0000 mL | Freq: Two times a day (BID) | INTRAVENOUS | Status: DC
  Administered 2016-06-15 – 2016-06-16 (×3): 3 mL via INTRAVENOUS

## 2016-06-15 MED ORDER — IOPAMIDOL (ISOVUE-300) INJECTION 61%
INTRAVENOUS | Status: AC
  Filled 2016-06-15: qty 50

## 2016-06-15 MED ORDER — SODIUM CHLORIDE 0.9 % IV SOLN
80.0000 mg | Freq: Once | INTRAVENOUS | Status: AC
  Administered 2016-06-15: 80 mg via INTRAVENOUS
  Filled 2016-06-15: qty 80

## 2016-06-15 MED ORDER — FENTANYL CITRATE (PF) 100 MCG/2ML IJ SOLN
INTRAMUSCULAR | Status: AC
Start: 2016-06-15 — End: 2016-06-16
  Filled 2016-06-15: qty 2

## 2016-06-15 NOTE — Sedation Documentation (Signed)
Patient is resting comfortably. 

## 2016-06-15 NOTE — Consult Note (Signed)
Chief Complaint: Patient was seen in consultation today for submassive pulmonary emboli  at the request of PCCM.       Supervising Physician: Oley BalmHassell, Lucia Mccreadie  [facility]  History of Present Illness: Carrie Joyce is a 45 y.o. female with PMH significant for anxiety, syringomyelia, dysfunctional uterine bleeding (on megace), IBS, interstitial cystitis and obesity who presents as a transfer [facility] for sub[facility]ive PE. She had been feeling weak and complaining of bilateral leg discomfort and exertional dyspnea for about 10 days. It had gotten to the point that she was mostly staying in bed with a heating pad on her legs. When she got up to ride to the store with her daughter, she had a syncopal episode. EMS was called and her family initiated CPR. When EMS arrived, she had a pulse. She was placed on oxygen and transported. [facility] large clot burden in bilateral pulmonary arteries extending into all lobes with RV:LV ratio of 2. Troponin was mildly elevated at 0.32, ECG showed S1,Q3,T3 pattern. BNP not obtained. Mild leukocytosis.  She reports that she has been on estrogen for dysfunctional uterine bleeding for the past 6 months or so. No recent long travel or periods of immobility. No prior history of blood clots. She thinks her grandmother may have had a blood clot, but is unsure of the circumstances. She is up to date on appropriate screening (mammography) and had a recent colonoscopy for other indications that was also largely benign.  LGI brbpr several months ago attributed to diverticulosis. Recent colonoscopy and polypectomy.  Echo shows RV strain. LE US shows + DVT.Marland Kitchen.   Past Medical History:  Diagnosis Date  . Anxiety   . Asthma   . Cervical spinal cord injury (HCC)   . COPD (chronic obstructive pulmonary disease) (HCC)   . Depression   . Essential hypertension   . Insomnia   . Low back pain   . Myalgia     Past Surgical  History:  Procedure Laterality Date  . CHOLECYSTECTOMY      Allergies: Sulfa antibiotics; Codeine; Penicillins; and Prochlorperazine  Medications: Prior to Admission medications   Medication Sig Start Date End Date Taking? Authorizing Provider  albuterol (PROVENTIL HFA;VENTOLIN HFA) 108 (90 Base) MCG/ACT inhaler Inhale 2 puffs into the lungs every 6 (six) hours as needed for wheezing or shortness of breath.   Yes Historical Provider, MD  ALPRAZolam Prudy Feeler(XANAX) 1 MG tablet Take 1 mg by mouth at bedtime as needed for anxiety or sleep.  05/17/14  Yes Historical Provider, MD  baclofen (LIORESAL) 10 MG tablet Take 5-10 mg by mouth 2 (two) times daily as needed for muscle spasms. Reported on 10/04/2015 05/28/14  Yes Historical Provider, MD  BREO ELLIPTA 100-25 MCG/INH AEPB Inhale 1 puff into the lungs daily. 06/08/16  Yes Historical Provider, MD  DULoxetine (CYMBALTA) 60 MG capsule Take 120 mg by mouth every morning.  05/07/14  Yes Historical Provider, MD  gabapentin (NEURONTIN) 800 MG tablet Take 800 mg by mouth 2 (two) times daily.  05/07/14  Yes Historical Provider, MD  hydrOXYzine (ATARAX/VISTARIL) 25 MG tablet Take 25 mg by mouth at bedtime.   Yes Historical Provider, MD  linaclotide (LINZESS) 145 MCG CAPS capsule Take 145 mcg by mouth at bedtime.   Yes Historical Provider, MD  lisinopril (PRINIVIL,ZESTRIL) 10 MG tablet Take 10 mg by mouth daily.  05/15/14  Yes Historical Provider, MD  megestrol (MEGACE) 40 MG tablet TAKE 2 TABS  BY MOUTH TWICE DAILY. WHEN BLEEDING STOPS DECREASE TO 1 TWICE DAILY Patient taking differently: Take 40 mg by mouth twice daily as needed for bleeding 01/02/16  Yes Adam Phenix, MD  Menthol, Topical Analgesic, (BIOFREEZE EX) Apply 1 application topically as needed (for pain).   Yes Historical Provider, MD  Multiple Vitamin (THERA) TABS Take 1 tablet by mouth daily.   Yes Historical Provider, MD  omeprazole (PRILOSEC) 20 MG capsule Take 20 mg by mouth daily as needed (for  acid reflux).   Yes Historical Provider, MD  promethazine (PHENERGAN) 25 MG tablet Take 25 mg by mouth every 6 (six) hours as needed for nausea or vomiting.   Yes Historical Provider, MD  rizatriptan (MAXALT) 10 MG tablet Take 10 mg by mouth as needed for migraine. May repeat in 2 hours if needed   Yes Historical Provider, MD  topiramate (TOPAMAX) 50 MG tablet Take 100 mg by mouth at bedtime.   Yes Historical Provider, MD     Family History  Problem Relation Age of Onset  . Hypertension Mother   . Fibromyalgia Mother   . SIDS Brother     Social History   Social History  . Marital status: Married    Spouse name: Dannielle Huh  . Number of children: 1  . Years of education: College   Occupational History  . [employer]    Social History Main Topics  . Smoking status: Never Smoker  . Smokeless tobacco: Never Used  . Alcohol use 0.0 oz/week     Comment: Occasional  . Drug use: No  . Sexual activity: Yes   Other Topics Concern  . Not on file   Social History Narrative   Patient is married.   Patient is left handed   Patient does not drink caffeine   Patient has college degree    ECOG Status: 2 - Symptomatic, <50% confined to bed  Review of Systems: A 12 point ROS discussed and pertinent positives are indicated in the HPI above.  All other systems are negative.  Review of Systems  Vital Signs: BP (!) 113/91   Pulse (!) 101   Temp 97.8 F (36.6 C) (Oral)   Resp (!) 22   Ht 5\' 4"  (1.626 m)   Wt 265 lb 14 oz (120.6 kg)   SpO2 100%   BMI 45.64 kg/m   Physical Exam  Mallampati Score:     Imaging:  CT ANGIOGRAPHY CHEST, ABDOMEN AND PELVIS  TECHNIQUE: Multidetector CT imaging through the chest, abdomen and pelvis was performed using the standard protocol during bolus administration of intravenous contrast. Multiplanar reconstructed images and MIPs were obtained and reviewed to evaluate the vascular anatomy.  CONTRAST: 100 mL of Isovue 370 IV contrast  COMPARISON:  CT of the abdomen and pelvis performed 04/20/2016, and CTA of the chest performed 01/10/2015  FINDINGS: CTA CHEST FINDINGS  Cardiovascular: Pulmonary embolus is noted within both main pulmonary arteries, extending into all lobes of both lungs. The RV/LV ratio is 2.0, corresponding to at least submassive pulmonary embolus and right heart strain.  The heart is otherwise unremarkable in appearance. The thoracic aorta is within normal limits. No calcific atherosclerotic disease is seen. The great vessels are unremarkable. There is no evidence of aortic dissection. There is no evidence of aneurysmal dilatation.  Mediastinum/Nodes: No mediastinal lymphadenopathy is seen. No pericardial effusion is identified. The visualized portions of thyroid gland are unremarkable. No axillary lymphadenopathy is seen.  Lungs/Pleura: The lungs are clear bilaterally. No focal consolidation,  pleural effusion or pneumothorax is seen. No masses are identified.  Musculoskeletal: No acute osseous abnormalities are identified. The visualized musculature is unremarkable in appearance.  Review of the MIP images confirms the above findings.  CTA ABDOMEN AND PELVIS FINDINGS  VASCULAR  Aorta: There is no evidence of aortic dissection. There is no evidence of aneurysmal dilatation. No calcific atherosclerotic disease is seen.  Celiac: The celiac trunk is unremarkable in appearance.  SMA: The superior mesenteric artery is within normal limits.  Renals: The renal arteries are intact bilaterally. Note is made of 2 left-sided renal arteries.  IMA: The inferior mesenteric artery remains patent.  Inflow: The common, internal and external iliac arteries are patent bilaterally. The common femoral arteries and their proximal branches are grossly unremarkable.  Veins: Visualized venous structures are grossly unremarkable in appearance. The inferior vena cava remains patent.  Review of the MIP images confirms  the above findings.  NON-VASCULAR  Hepatobiliary: The liver is unremarkable in appearance. The patient is status post cholecystectomy, with clips noted at the gallbladder fossa. The common bile duct remains normal in caliber.  Pancreas: The pancreas is within normal limits.  Spleen: The spleen is unremarkable in appearance.  Adrenals/Urinary Tract: The adrenal glands are unremarkable in appearance. The kidneys are within normal limits. There is no evidence of hydronephrosis. No renal or ureteral stones are identified. No perinephric stranding is seen.  Stomach/Bowel: The stomach is unremarkable in appearance. The small bowel is within normal limits. The appendix is normal in caliber, without evidence of appendicitis. The colon is unremarkable in appearance.  Lymphatic: No retroperitoneal lymphadenopathy is seen. No pelvic sidewall lymphadenopathy is identified.  Reproductive: The bladder is mildly distended and grossly unremarkable. A likely fibroid is noted at the uterine fundus. The ovaries are grossly symmetric. No suspicious adnexal masses are seen.  Other: No additional soft tissue abnormalities are seen.  Musculoskeletal: No acute osseous abnormalities are identified. The visualized musculature is unremarkable in appearance.  Review of the MIP images confirms the above findings.  IMPRESSION: 1. Pulmonary embolus within the main pulmonary arteries, extending into all lobes of both lungs. Positive for acute PE with CT evidence of right heart strain (RV/LV Ratio = 2.0) consistent with at least submassive (intermediate risk) PE. The presence of right heart strain has been associated with an increased risk of morbidity and mortality. 2. No evidence of aortic dissection. No evidence of aneurysmal dilatation. No calcific atherosclerotic disease seen. 3. Lungs clear bilaterally. 4. Uterine fibroid noted.  Critical Value/emergent results were called by telephone at the  time of interpretation on 06/14/2016 at 10:36 pm to [facility], who verbally acknowledged these results.   Electronically Signed By: Roanna Raider M.D. On: 06/14/2016 22:44   Labs:  CBC:  Recent Labs  10/04/15 1115 06/15/16 0433  WBC 8.9 11.4*  HGB 12.3 12.5  HCT 37.3 38.1  PLT 408* 206    COAGS: No results for input(s): INR, APTT in the last 8760 hours.  BMP:  Recent Labs  06/15/16 0433  NA 135  K 4.5  CL 108  CO2 20*  GLUCOSE 141*  BUN 7  CALCIUM 8.3*  CREATININE 1.18*  GFRNONAA 55*  GFRAA >60   Troponin I <0.03 ng/mL  0.59   LIVER FUNCTION TESTS: No results for input(s): BILITOT, AST, ALT, ALKPHOS, PROT, ALBUMIN in the last 8760 hours.  TUMOR MARKERS: No results for input(s): AFPTM, CEA, CA199, CHROMGRNA in the last 8760 hours.  Assessment and Plan:  45 y/o  female with LE DVT and submassive bilateral pulmonary emboli. No absolute contraindications to PE lysis. I discussed with the pt and her family the pathophysiology of thromboembolic disease, natural history; possible risk of chronic PE/pulmonary hypertension; treatment options; the USAT PE lysis procedure, technique details, risks including ICH and other bleeding, anticipated benefits. SHe and her family understand, had their questions answered. She is motivated to proceed. Accordingly, we will plan for bilateral pulm arterial USAT via R IJ with moderate sedation, overnight infusion with ICU placement.  Thank you for this interesting consult.  I greatly enjoyed meeting Talyia Roop Sanjurjo and look forward to participating in their care.  A copy of this report was sent to the requesting provider on this date.  Electronically Signed: Amica Harron III, DAYNE Noah Lembke 06/15/2016, 5:44 PM   I spent a total of 40 Minutes    in face to face in clinical consultation, greater than 50% of which was counseling/coordinating care for submassive pulmonary emboli.

## 2016-06-15 NOTE — Progress Notes (Signed)
**  Preliminary report by tech**  Bilateral lower extremity venous duplex complete. There is evidence of deep vein thrombosis involving the posterior tibial, and peroneal veins of the right lower extremity.  There is no evidence of deep vein thrombosis involving the left lower extremity. There is no evidence of superficial vein thrombosis involving the right or left lower extremities. There is no evidence of Baker's cysts bilaterally. Results were given to the patient's nurse, Jamesetta SoPhyllis.  06/15/16 4:29 PM Olen CordialGreg Pate Aylward RVT

## 2016-06-15 NOTE — Sedation Documentation (Signed)
Pt is very tearful, reassured pt.

## 2016-06-15 NOTE — Sedation Documentation (Signed)
Right PAP 39/23 (30)

## 2016-06-15 NOTE — H&P (Signed)
PULMONARY / CRITICAL CARE MEDICINE   Name: Carrie Joyce MRN: [id number] DOB: 05/25/1971    ADMISSION DATE:  06/15/2016 CONSULTATION DATE:    Cindi CarbonEFERRING MD:  [facility]  CHIEF COMPLAINT:  Shortness of breath, syncopal  HISTORY OF PRESENT ILLNESS:   [name] is a [age]F with PMH significant for anxiety, syringomyelia, dysfunctional uterine bleeding (on megace), IBS, interstitial cystitis and obesity who presents as a [facility]. She has been feeling weak and complaining of bilateral leg discomfort and exertional dyspnea for about 10 days. It had gotten to the point that she was mostly staying in bed with a heating pad on her legs. When she got up to ride to the store with her daughter, she had a syncopal episode. EMS was called and her family initiated CPR. When EMS arrived, she had a pulse. She was placed on oxygen and transported. [facility] showed large clot burden in bilateral pulmonary arteries extending into all lobes with RV:LV ratio of 2. Troponin was mildly elevated at 0.32, ECG showed S1,Q3,T3 pattern. BNP not obtained. Mild leukocytosis.  She reports that she has been on estrogen for dysfunctional uterine bleeding for the past 6 months or so. No recent long travel or periods of immobility. No prior history of blood clots. She thinks her grandmother may have had a blood clot, but is unsure of the circumstances. She is up to date on appropriate screening (mammography) and had a recent colonoscopy for other indications that was also largely benign.  Currently she endorses chest pain across the front of her chest, shortness of breath, anxiety and some mild leg/calf tightness R > L. She denies hemoptysis, cough, sputum, pleuritic pain, palpitations.   PAST MEDICAL HISTORY :  She  has a past medical history of Anxiety; Asthma; Cervical spinal cord injury (HCC); COPD (chronic obstructive pulmonary disease) (HCC); Depression; Essential hypertension;  Insomnia; Low back pain; and Myalgia.  PAST SURGICAL HISTORY: She  has a past surgical history that includes Cholecystectomy.  Allergies  Allergen Reactions  . Codeine Nausea And Vomiting and Nausea Only  . Penicillins Hives  . Prochlorperazine Itching  . Sulfa Antibiotics Hives and Swelling    No current facility-administered medications on file prior to encounter.    Current Outpatient Prescriptions on File Prior to Encounter  Medication Sig  . ALPRAZolam (XANAX) 1 MG tablet Take 1 tablet by mouth at bedtime.  . baclofen (LIORESAL) 10 MG tablet Take 1 tablet by mouth as needed. Reported on 10/04/2015  . budesonide-formoterol (SYMBICORT) 160-4.5 MCG/ACT inhaler Inhale 1 puff into the lungs 2 (two) times daily.  . Calcium Carbonate-Vit D-Min (CALCIUM 600+D PLUS MINERALS) 600-400 MG-UNIT TABS Take 1 tablet by mouth daily.  . diazepam (VALIUM) 5 MG tablet Take 1 tablet by mouth every 8 (eight) hours.  . DULoxetine (CYMBALTA) 60 MG capsule Take 2 capsules by mouth every morning.  . gabapentin (NEURONTIN) 800 MG tablet Take 1 tablet by mouth 2 (two) times daily.  Marland Kitchen. lisinopril (PRINIVIL,ZESTRIL) 10 MG tablet Take 1 tablet by mouth daily.  . magnesium oxide (MAG-OX) 400 MG tablet Take 1 tablet by mouth at bedtime.  . megestrol (MEGACE) 40 MG tablet TAKE 2 TABS BY MOUTH TWICE DAILY. WHEN BLEEDING STOPS DECREASE TO 1 TWICE DAILY  . Multiple Vitamin (THERA) TABS Take 1 tablet by mouth daily.  . traMADol (ULTRAM) 50 MG tablet Take 1 tablet by mouth as needed.    FAMILY HISTORY:  Her indicated that her  mother is alive. She indicated that her father is deceased. She indicated that one of her two brothers is deceased.    SOCIAL HISTORY: She  reports that she has never smoked. She has never used smokeless tobacco. She reports that she drinks alcohol. She reports that she does not use drugs.  REVIEW OF SYSTEMS:   General: no weight change, no fever, no chills Cardiovascular: see  HPI Respiratory: see HPI Gastrointestinal: no nausea, vomiting, diarrhea, or constipation Genitourinary: +pain with urination, hx of interstitial cystitis and kidney infections. No foul odor, no frequency, no urgency Musculoskeletal: no joint pain or swelling, no muscle aches Skin: no rashes, sores, or ulcers Endocrine: + thyroid nodules. no blood sugar problems Neurologic: no new numbness, tingling, dizziness, or visual changes; hx migraines and chronic neuropathy Hematologic: + hx dyfunctional uterine bleeding. No known clotting problems Psychiatric: + hx depression/anxiety, no suicidal ideation or intent   SUBJECTIVE:    VITAL SIGNS: BP 109/89   Pulse 100   Temp 97.6 F (36.4 C) (Oral)   Resp 20   Ht 5\' 4"  (1.626 m)   Wt 120.6 kg (265 lb 14 oz)   SpO2 93%   BMI 45.64 kg/m   HEMODYNAMICS:    VENTILATOR SETTINGS:    INTAKE / OUTPUT: No intake/output data recorded.  PHYSICAL EXAMINATION:  General Well nourished, well developed, no apparent distress, obese  HEENT No gross abnormalities. Oropharynx clear. Mallampati IV. Good dentition.   Pulmonary Clear to auscultation bilaterally with no wheezes, rales or ronchi. Good effort, symmetrical expansion.   Cardiovascular Mild tachycardia, regular rhythm. S1, s2. No m/r/g. Distal pulses palpable.  Abdomen Soft, non-tender, non-distended, positive bowel sounds, no palpable organomegaly or masses. Obese. Normoresonant to percussion.  Musculoskeletal Grossly normal; mild tenderness to palpation of R calf.   Lymphatics No cervical, supraclavicular or axillary adenopathy.   Neurologic Grossly intact. No focal deficits.   Skin/Integuement No rash, no cyanosis, no clubbing. No edema.     LABS:  BMET No results for input(s): NA, K, CL, CO2, BUN, CREATININE, GLUCOSE in the last 168 hours.  Electrolytes No results for input(s): CALCIUM, MG, PHOS in the last 168 hours.  CBC No results for input(s): WBC, HGB, HCT, PLT in the  last 168 hours.  Coag's No results for input(s): APTT, INR in the last 168 hours.  Sepsis Markers No results for input(s): LATICACIDVEN, PROCALCITON, O2SATVEN in the last 168 hours.  ABG No results for input(s): PHART, PCO2ART, PO2ART in the last 168 hours.  Liver Enzymes No results for input(s): AST, ALT, ALKPHOS, BILITOT, ALBUMIN in the last 168 hours.  Cardiac Enzymes No results for input(s): TROPONINI, PROBNP in the last 168 hours.  Glucose  Recent Labs Lab 06/15/16 0215  GLUCAP 142*    Imaging No results found.   STUDIES:  Outside CTA Chest: Pulmonary embolus noted within both main pulmonary arteries, extending into all lobes of both lungs. The RV/LV ratio is 2.0.   CULTURES: None  ANTIBIOTICS: None  SIGNIFICANT EVENTS:   LINES/TUBES: PIV  DISCUSSION: Mrs. Tramontana is a 48F with PMH as above, admitted with submassive PE. CT findings in combination with ECG, troponin, and reported syncopal episode are suggestive of significant clot burden. She may be a good candidate for EKOS. Given her current hemodynamic stability, I would favor obtaining additional data to include troponin, BNP, TTE and LE dopplers prior to involving IR. Continue heparin gtt while workup ongoing and maintain NPO pending possible procedure.   ASSESSMENT / PLAN:  PULMONARY A: Acute submassive pulmonary emboli P:   Continue heparin gtt Supplemental oxygen as needed to maintain sats > 90% Troponin, BNP, TTE, to further evaluate degree of RH strain Consider EKOS once above complete  CARDIOVASCULAR A:  Submassive PE, currently hemodynamically stable Hx HTN P:  Workup as above  RENAL A:   Dysuria P:   Check UA  GASTROINTESTINAL A:   No acute issues Hx IBS P:   NPO pending procedure  HEMATOLOGIC A:   Mild leukocytosis - likely reactive Hx dysfunctional uterine bleeding P:  Trend CBC Stop estrogen  INFECTIOUS A:   No acute issues P:   F/u UA  ENDOCRINE A:   No  acute issues   P:     NEUROLOGIC A:   No acute issues Hx syringomyelia P:   RASS goal: 0    FAMILY  - Updates: Discussion with patient. She is amenable to consideration of EKOS, if workup suggests it may be beneficial. She desires to remain FULL CODE.   - Inter-disciplinary family meet or Palliative Care meeting due by:  day 7  The patient is critically ill with multiple organ system failure and requires high complexity decision making for assessment and support, frequent evaluation and titration of therapies, advanced monitoring, review of radiographic studies and interpretation of complex data.   Critical Care Time devoted to patient care services, exclusive of separately billable procedures, described in this note is 56 minutes.   Nita SickleSarah Ellen E. Torrey Ballinas, MD Pulmonary and Critical Care Medicine [facility] Pager: [phone]  06/15/2016, 3:30 AM

## 2016-06-15 NOTE — Progress Notes (Signed)
  Echocardiogram 2D Echocardiogram has been performed.  Carrie Joyce 06/15/2016, 1:40 PM

## 2016-06-15 NOTE — Progress Notes (Signed)
CRITICAL VALUE ALERT  Critical value received:  Troponin 1.28  Date of notification:  06/15/2016  Time of notification:  0550  Critical value read back:Yes.    Nurse who received alert:  Lillia CorporalHancock, Dua Mehler Elizabeth  MD notified (1st page):  Dr. Kendrick FriesMcQuaid  Time of first page:  [phone]  MD notified (2nd page):  Time of second page:  Responding MD:  Dr. Kendrick FriesMcQuaid  Time MD responded:  [phone]

## 2016-06-15 NOTE — Progress Notes (Signed)
Called report to 2MW RN. Pt tolerated procedure very well. Vitals are stable at this time for transport. Pt states that the pain in her legs is the same, no relief with pain medicine. Jackolyn ConferEvelyn IR RN will assist transport pt to 2MW

## 2016-06-15 NOTE — Procedures (Signed)
Bilateral pulmonary arterial  US-assisted thrombolysis via R IJ initiated No complication No blood loss. [facility]. Plan f/u in AM.

## 2016-06-15 NOTE — Sedation Documentation (Signed)
Vital signs stable. 

## 2016-06-15 NOTE — Progress Notes (Addendum)
ANTICOAGULATION CONSULT NOTE - Initial Consult  Pharmacy Consult for heparin Indication: pulmonary embolus  Allergies  Allergen Reactions  . Codeine Nausea And Vomiting and Nausea Only  . Penicillins Hives  . Prochlorperazine Itching  . Sulfa Antibiotics Hives and Swelling    Patient Measurements: Height: 5\' 4"  (162.6 cm) Weight: 265 lb 14 oz (120.6 kg) IBW/kg (Calculated) : 54.7 Heparin Dosing Weight: 85kg  Vital Signs: Temp: 97.6 F (36.4 C) (12/04 0217) Temp Source: Oral (12/04 0217) BP: 109/89 (12/04 0300) Pulse Rate: 100 (12/04 0300)    Medical History: Past Medical History:  Diagnosis Date  . Anxiety   . Asthma   . Cervical spinal cord injury (HCC)   . COPD (chronic obstructive pulmonary disease) (HCC)   . Depression   . Essential hypertension   . Insomnia   . Low back pain   . Myalgia      Assessment: 45[facility], being tx'd for PE w/ heparin.  Goal of Therapy:  Heparin level 0.3-0.7 units/ml Monitor platelets by anticoagulation protocol: Yes   Plan:  OSH started heparin 5000 units bolus followed by gtt at 1350 units/hr; will continue for now and monitor heparin levels and CBC.  Vernard GamblesVeronda Wynell Halberg, PharmD, BCPS  06/15/2016,3:39 AM   ADDENDUM: Initial heparin level 0.79; drew lab early to prevent multiple sticks so likely still 2/2 bolus.  Will continue heparin gtt at same rate for now and check another level. VB 5:28 AM

## 2016-06-15 NOTE — Progress Notes (Signed)
Patient transferred to IR via monitor and oxygen

## 2016-06-15 NOTE — Progress Notes (Signed)
2nd call to Echo and I spoke with Lupita Leashonna and Leotis ShamesLauren about Dr Crista CurbNestor's request for Echo to be performed this AM

## 2016-06-15 NOTE — Progress Notes (Addendum)
Called ECHO and spoke with Carrie Joyce to request ECHO to be performed this AM per Dr Crista CurbNestor's request .Order is in Epic

## 2016-06-15 NOTE — Sedation Documentation (Signed)
Pt states that she has shooting pain in her legs

## 2016-06-15 NOTE — Progress Notes (Addendum)
PULMONARY / CRITICAL CARE MEDICINE   Name: Terril Prescilla SoursRoop Pascoe MRN: [id number] DOB: 12/21/1970    ADMISSION DATE:  06/15/2016 CONSULTATION DATE:    Cindi CarbonEFERRING MD:  Duke Salviaandolph  CHIEF COMPLAINT:  Shortness of breath, syncopal  HISTORY OF PRESENT ILLNESS:   [name] is a [age]F with PMH significant for anxiety, syringomyelia, dysfunctional uterine bleeding (on megace), IBS, interstitial cystitis and obesity who [facility]. She has been feeling weak and complaining of bilateral leg discomfort and exertional dyspnea for about 10 days. It had gotten to the point that she was mostly staying in bed with a heating pad on her legs. When she got up to ride to the store with her daughter, she had a syncopal episode. EMS was called and her family initiated CPR. When EMS arrived, she had a pulse. She was placed on oxygen and transported. [facility] showed large clot burden in bilateral pulmonary arteries extending into all lobes with RV:LV ratio of 2. Troponin was mildly elevated at 0.32, ECG showed S1,Q3,T3 pattern. BNP not obtained. Mild leukocytosis.  She reports that she has been on estrogen for dysfunctional uterine bleeding for the past 6 months or so. No recent long travel or periods of immobility. No prior history of blood clots. She thinks her grandmother may have had a blood clot, but is unsure of the circumstances. She is up to date on appropriate screening (mammography) and had a recent colonoscopy for other indications that was also largely benign.  Currently she endorses chest pain across the front of her chest, shortness of breath, anxiety and some mild leg/calf tightness R > L. She denies hemoptysis, cough, sputum, pleuritic pain, palpitations.   SUBJECTIVE:  Patient still c/o chest tightness and discomfort. Dyspnea and cough persist. She admit she does use Aleve intermittently for pain.   REVIEW OF SYSTEMS:  Has been having intermittent  hematochezia. Reports mild diffuse abdominal pain. No fever or chills.   VITAL SIGNS: BP (!) 119/101 (BP Location: Left Arm)   Pulse 92   Temp 98.4 F (36.9 C) (Oral)   Resp 19   Ht 5\' 4"  (1.626 m)   Wt 265 lb 14 oz (120.6 kg)   SpO2 95%   BMI 45.64 kg/m   HEMODYNAMICS:    VENTILATOR SETTINGS:    INTAKE / OUTPUT: I/O last 3 completed shifts: In: 45.4 [I.V.:45.4] Out: 200 [Urine:200]  PHYSICAL EXAMINATION:  General Well nourished, well developed, no apparent distress, obese  HEENT No gross abnormalities. Oropharynx clear. Mallampati IV. Good dentition.   Pulmonary Clear to auscultation bilaterally with no wheezes, rales or ronchi. Good effort, symmetrical expansion.   Cardiovascular Mild tachycardia, regular rhythm. S1, s2. No m/r/g. Distal pulses palpable.  Abdomen Soft, non-tender, non-distended, positive bowel sounds, no palpable organomegaly or masses. Obese. Normoresonant to percussion.  Musculoskeletal Grossly normal; mild tenderness to palpation of R calf.   Lymphatics No cervical, supraclavicular or axillary adenopathy.   Neurologic Grossly intact. No focal deficits.   Skin/Integuement No rash, no cyanosis, no clubbing. No edema.     LABS:  BMET  Recent Labs Lab 06/15/16 0433  NA 135  K 4.5  CL 108  CO2 20*  BUN 7  CREATININE 1.18*  GLUCOSE 141*    Electrolytes  Recent Labs Lab 06/15/16 0433  CALCIUM 8.3*  MG 2.2  PHOS 3.8    CBC  Recent Labs Lab 06/15/16 0433  WBC 11.4*  HGB 12.5  HCT 38.1  PLT 206    Coag's No results for input(s): APTT, INR in the last 168 hours.  Sepsis Markers No results for input(s): LATICACIDVEN, PROCALCITON, O2SATVEN in the last 168 hours.  ABG No results for input(s): PHART, PCO2ART, PO2ART in the last 168 hours.  Liver Enzymes No results for input(s): AST, ALT, ALKPHOS, BILITOT, ALBUMIN in the last 168 hours.  Cardiac Enzymes  Recent Labs Lab 06/15/16 0433  TROPONINI 1.28*     Glucose  Recent Labs Lab 06/15/16 0215  GLUCAP 142*    Imaging No results found.   STUDIES:  Outside CTA Chest: Pulmonary embolus noted within both main pulmonary arteries, extending into all lobes of both lungs. The RV/LV ratio is 2.0.  Outside CT Head W/O:  No bleeding or mass.  TTE 12/4 >>  MICROBIOLOGY: MRSA PCR 12/4:  Negative   ANTIBIOTICS: None  SIGNIFICANT EVENTS: 12/04 - [facility]  LINES/TUBES: PIV  ASSESSMENT / PLAN:  PULMONARY A: Acute submassive pulmonary emboli H/O COPD/Asthma  P:   Continue heparin gtt Supplemental oxygen as needed to maintain sats > 90% Consider EKOS once above complete Duoneb prn Holding Breo  CARDIOVASCULAR A:  Submassive PE - Syncopized & arrested at home? H/O HTN  P:  Continuous telemetry monitoring Vitals per unit protocol Trending Troponin I q6hr Awaiting TTE & Venous Duplex Holding Lisinpril  RENAL A:   Dysuria - U/A negative on admit.  P:   Monitoring UOP Trending renal function & electrolytes daily  GASTROINTESTINAL A:   GI Bleeding - Colonoscopy showing hemorrhoids & diverticulosis.  H/O  IBS  P:   NPO pending procedure Protonix bolus & drip  Holding home Prilosec  HEMATOLOGIC A:   Acute Pulmonary Embolism Leukocytosis - Mild. Likely reactive demargination. H/O Dysfunctional Uterine Bleeding   P:  Trending cell counts daily w/ CBC Holding estrogen Heparin drip per pharmacy protocol Stat Type & Screen  INFECTIOUS A:   No acute issues  P:   Monitoring for signs/symptoms of infection.  ENDOCRINE A:   Hyperglycemia - Mild.  P:   Monitor glucose on daily labs.  NEUROLOGIC A:   No acute issues H/O syringomyelia H/O Anxiety/Depression H/O Chronic Pain   P:   RASS goal: 0 Xanax po QHS Neurontin PO BID Restarting home Atarax, Topamax, & Cymbalta  FAMILY  - Updates: Discussion with patient. She is amenable to consideration of EKOS, if workup  suggests it may be beneficial. She desires to remain FULL CODE.   - Inter-disciplinary family meet or Palliative Care meeting due by:  12/11  TODAY'S SUMMARY:  45 y.o. female with submassive PE.  Patient with syncopal episode likely from clot burden. Awaiting echo results before considering catheter directed lytic therapy.  I have spent an additional 37 minutes of critical care time today caring for the patient and reviewing the patient's electronic medical record.   Donna ChristenJennings E. Jamison NeighborNestor, M.D. [facility] Pager:  [phone] After 3pm or if no response, call [phone] 06/15/2016, 8:29 AM

## 2016-06-15 NOTE — Sedation Documentation (Signed)
Patient is resting comfortably. No complaints at this time 

## 2016-06-15 NOTE — Progress Notes (Signed)
ANTICOAGULATION CONSULT NOTE  Pharmacy Consult for heparin  Indication: pulmonary embolus  Allergies  Allergen Reactions  . Sulfa Antibiotics Hives and Swelling  . Codeine Nausea And Vomiting and Nausea Only  . Penicillins Hives  . Prochlorperazine Itching    Patient Measurements: Height: 5\' 4"  (162.6 cm) Weight: 265 lb 14 oz (120.6 kg) IBW/kg (Calculated) : 54.7 Heparin Dosing Weight:   Vital Signs: Temp: 97.8 F (36.6 C) (12/04 1616) Temp Source: Oral (12/04 1616) BP: 107/74 (12/04 1843) Pulse Rate: 102 (12/04 1843)  Labs:  Recent Labs  06/15/16 0433 06/15/16 0954 06/15/16 1454 06/15/16 1718  HGB 12.5  --   --   --   HCT 38.1  --   --   --   PLT 206  --   --   --   HEPARINUNFRC 0.79* 0.68  --  0.54  CREATININE 1.18*  --   --   --   TROPONINI 1.28* 0.98* 0.59*  --     Estimated Creatinine Clearance: 77.9 mL/min (by C-G formula based on SCr of 1.18 mg/dL (H)).   Medical History: Past Medical History:  Diagnosis Date  . Anxiety   . Asthma   . Cervical spinal cord injury (HCC)   . COPD (chronic obstructive pulmonary disease) (HCC)   . Depression   . Essential hypertension   . Insomnia   . Low back pain   . Myalgia      Assessment: 45 yo female with acute submassive PE and RLE DVT on heparin and undergoing USAT with alteplase.  -heparin level is at goal   Goal of Therapy:  Heparin level 0.3-0.7 units/ml Monitor platelets by anticoagulation protocol: Yes    Plan: -No heparin changes needed -Daily HL, CBC  Harland Germanndrew Dynisha Due, Pharm D 06/15/2016 6:51 PM

## 2016-06-15 NOTE — Progress Notes (Signed)
ANTICOAGULATION CONSULT NOTE  Pharmacy Consult for heparin  Indication: pulmonary embolus  Allergies  Allergen Reactions  . Sulfa Antibiotics Hives and Swelling  . Codeine Nausea And Vomiting and Nausea Only  . Penicillins Hives  . Prochlorperazine Itching    Patient Measurements: Height: 5\' 4"  (162.6 cm) Weight: 265 lb 14 oz (120.6 kg) IBW/kg (Calculated) : 54.7 Heparin Dosing Weight:   Vital Signs: Temp: 98.4 F (36.9 C) (12/04 0727) Temp Source: Oral (12/04 0727) BP: 115/95 (12/04 1000) Pulse Rate: 100 (12/04 1000)  Labs:  Recent Labs  06/15/16 0433  HGB 12.5  HCT 38.1  PLT 206  HEPARINUNFRC 0.79*  CREATININE 1.18*  TROPONINI 1.28*    Estimated Creatinine Clearance: 77.9 mL/min (by C-G formula based on SCr of 1.18 mg/dL (H)).   Medical History: Past Medical History:  Diagnosis Date  . Anxiety   . Asthma   . Cervical spinal cord injury (HCC)   . COPD (chronic obstructive pulmonary disease) (HCC)   . Depression   . Essential hypertension   . Insomnia   . Low back pain   . Myalgia      Assessment: 45 yo female with acute submassive PE. On heparin gtt, initial level was slightly supratherapeutic, no associated bleeding. Awaiting ECHO to determine if pt will require EKOS. CBC wnl.   Goal of Therapy:  Heparin level 0.3-0.7 units/ml Monitor platelets by anticoagulation protocol: Yes    Plan:  -Reduce heparin gtt to 1250 units/hr -Daily HL, CBC -Check level this afternoon   Baldemar FridayMasters, Saryn Cherry M 06/15/2016,10:57 AM

## 2016-06-16 ENCOUNTER — Inpatient Hospital Stay (HOSPITAL_COMMUNITY): Payer: BLUE CROSS/BLUE SHIELD

## 2016-06-16 ENCOUNTER — Encounter (HOSPITAL_COMMUNITY): Payer: Self-pay | Admitting: Interventional Radiology

## 2016-06-16 DIAGNOSIS — I2601 Septic pulmonary embolism with acute cor pulmonale: Secondary | ICD-10-CM

## 2016-06-16 DIAGNOSIS — D6489 Other specified anemias: Secondary | ICD-10-CM

## 2016-06-16 DIAGNOSIS — J9621 Acute and chronic respiratory failure with hypoxia: Secondary | ICD-10-CM

## 2016-06-16 HISTORY — PX: IR GENERIC HISTORICAL: IMG1180011

## 2016-06-16 LAB — CBC
HCT: 34.7 % — ABNORMAL LOW (ref 36.0–46.0)
HCT: 33.6 % — ABNORMAL LOW (ref 36.0–46.0)
HEMATOCRIT: 35 % — AB (ref 36.0–46.0)
HEMOGLOBIN: 11.3 g/dL — AB (ref 12.0–15.0)
HEMOGLOBIN: 11 g/dL — AB (ref 12.0–15.0)
Hemoglobin: 11.4 g/dL — ABNORMAL LOW (ref 12.0–15.0)
MCH: 28.9 pg (ref 26.0–34.0)
MCH: 28.3 pg (ref 26.0–34.0)
MCH: 28.6 pg (ref 26.0–34.0)
MCHC: 32.9 g/dL (ref 30.0–36.0)
MCHC: 32.3 g/dL (ref 30.0–36.0)
MCHC: 32.7 g/dL (ref 30.0–36.0)
MCV: 87.8 fL (ref 78.0–100.0)
MCV: 87.5 fL (ref 78.0–100.0)
MCV: 87.3 fL (ref 78.0–100.0)
PLATELETS: 162 10*3/uL (ref 150–400)
PLATELETS: 162 10*3/uL (ref 150–400)
Platelets: 156 10*3/uL (ref 150–400)
RBC: 3.95 MIL/uL (ref 3.87–5.11)
RBC: 4 MIL/uL (ref 3.87–5.11)
RBC: 3.85 MIL/uL — AB (ref 3.87–5.11)
RDW: 14.4 % (ref 11.5–15.5)
RDW: 13.9 % (ref 11.5–15.5)
RDW: 13.7 % (ref 11.5–15.5)
WBC: 8.9 10*3/uL (ref 4.0–10.5)
WBC: 9.7 10*3/uL (ref 4.0–10.5)
WBC: 8.9 10*3/uL (ref 4.0–10.5)

## 2016-06-16 LAB — RENAL FUNCTION PANEL
ALBUMIN: 2.8 g/dL — AB (ref 3.5–5.0)
ANION GAP: 8 (ref 5–15)
BUN: 7 mg/dL (ref 6–20)
CALCIUM: 8 mg/dL — AB (ref 8.9–10.3)
CO2: 17 mmol/L — ABNORMAL LOW (ref 22–32)
Chloride: 112 mmol/L — ABNORMAL HIGH (ref 101–111)
Creatinine, Ser: 1.13 mg/dL — ABNORMAL HIGH (ref 0.44–1.00)
GFR, EST NON AFRICAN AMERICAN: 58 mL/min — AB (ref >60)
GLUCOSE: 97 mg/dL (ref 65–99)
PHOSPHORUS: 3.7 mg/dL (ref 2.5–4.6)
POTASSIUM: 3.7 mmol/L (ref 3.5–5.1)
Sodium: 137 mmol/L (ref 135–145)

## 2016-06-16 LAB — FIBRINOGEN
FIBRINOGEN: 384 mg/dL (ref 210–475)
FIBRINOGEN: 382 mg/dL (ref 210–475)
FIBRINOGEN: 375 mg/dL (ref 210–475)

## 2016-06-16 LAB — HEPARIN LEVEL (UNFRACTIONATED)
HEPARIN UNFRACTIONATED: 0.4 [IU]/mL (ref 0.30–0.70)
HEPARIN UNFRACTIONATED: 0.43 [IU]/mL (ref 0.30–0.70)
HEPARIN UNFRACTIONATED: 0.31 [IU]/mL (ref 0.30–0.70)

## 2016-06-16 LAB — MAGNESIUM: Magnesium: 2.1 mg/dL (ref 1.7–2.4)

## 2016-06-16 NOTE — Progress Notes (Signed)
PULMONARY / CRITICAL CARE MEDICINE   Name: Carrie Joyce MRN: [id number] DOB: 01/10/1971    ADMISSION DATE:  06/15/2016 CONSULTATION DATE:    Cindi CarbonEFERRING MD:  Duke Salviaandolph  CHIEF COMPLAINT:  Shortness of breath, syncopal  HISTORY OF PRESENT ILLNESS:   Carrie Joyce is a [age]F with PMH significant for anxiety, syringomyelia, dysfunctional uterine bleeding (on megace), IBS, interstitial cystitis and obesity who [facility]. She has been feeling weak and complaining of bilateral leg discomfort and exertional dyspnea for about 10 days. It had gotten to the point that she was mostly staying in bed with a heating pad on her legs. When she got up to ride to the store with her daughter, she had a syncopal episode. EMS was called and her family initiated CPR. When EMS arrived, she had a pulse. She was placed on oxygen and transported. [facility] clot burden in bilateral pulmonary arteries extending into all lobes with RV:LV ratio of 2. Troponin was mildly elevated at 0.32, ECG showed S1,Q3,T3 pattern. BNP not obtained. Mild leukocytosis.  She reports that she has been on estrogen for dysfunctional uterine bleeding for the past 6 months or so. No recent long travel or periods of immobility. No prior history of blood clots. She thinks her grandmother may have had a blood clot, but is unsure of the circumstances. She is up to date on appropriate screening (mammography) and had a recent colonoscopy for other indications that was also largely benign.  Currently she endorses chest pain across the front of her chest, shortness of breath, anxiety and some mild leg/calf tightness R > L. She denies hemoptysis, cough, sputum, pleuritic pain, palpitations.   SUBJECTIVE:  Patient started on EKOS yesterday after echo completed. Denies any cough now. Dyspnea unchanged. Chest pressure and tightness unchanged.  REVIEW OF SYSTEMS:  Mild nausea but no  diarrhea. No hematochezia per report. No abdominal pain. No headache or vision changes.   VITAL SIGNS: BP 132/85   Pulse 91   Temp 98.3 F (36.8 C) (Axillary)   Resp (!) 23   Ht 5\' 4"  (1.626 m)   Wt 270 lb 8.1 oz (122.7 kg)   SpO2 98%   BMI 46.43 kg/m   HEMODYNAMICS:    VENTILATOR SETTINGS:    INTAKE / OUTPUT: I/O last 3 completed shifts: In: 2204.9 [I.V.:2104.9; IV Piggyback:100] Out: 755 [Urine:755]  PHYSICAL EXAMINATION: General:  Awake. Alert. No acute distress. No family at bedside.  Integument:  Warm & dry. No rash on exposed skin.  HEENT:  Moist mucus membranes. No oral ulcers. No scleral injection or icterus.  Cardiovascular:  Regular rate and rhythm. No appreciable JVD.  Pulmonary:  Good aeration & clear to auscultation bilaterally. Symmetric chest wall expansion. No accessory muscle use on nasal cannula. Abdomen: Soft. Normal bowel sounds. Protuberant. Musculoskeletal:  Normal bulk and tone. No joint deformity or effusion appreciated. Neurological:  CN 2-12 in tact. No meningismus. Following commands. A&Ox4.   LABS:  BMET  Recent Labs Lab 06/15/16 0433 06/16/16 0400  NA 135 137  K 4.5 3.7  CL 108 112*  CO2 20* 17*  BUN 7 7  CREATININE 1.18* 1.13*  GLUCOSE 141* 97    Electrolytes  Recent Labs Lab 06/15/16 0433 06/16/16 0400  CALCIUM 8.3* 8.0*  MG 2.2 2.1  PHOS 3.8 3.7    CBC  Recent Labs Lab 06/15/16 0433 06/15/16 2117 06/16/16 0400  WBC 11.4* 10.2 8.9  HGB 12.5 12.1 11.4*  HCT 38.1 37.3 34.7*  PLT 206 183 162    Coag's No results for input(s): APTT, INR in the last 168 hours.  Sepsis Markers No results for input(s): LATICACIDVEN, PROCALCITON, O2SATVEN in the last 168 hours.  ABG No results for input(s): PHART, PCO2ART, PO2ART in the last 168 hours.  Liver Enzymes  Recent Labs Lab 06/16/16 0400  ALBUMIN 2.8*    Cardiac Enzymes  Recent Labs Lab 06/15/16 0433 06/15/16 0954 06/15/16 1454  TROPONINI 1.28* 0.98*  0.59*    Glucose  Recent Labs Lab 06/15/16 0215 06/15/16 1614  GLUCAP 142* 97    Imaging No results found.   STUDIES:  Outside CTA Chest: Pulmonary embolus noted within both main pulmonary arteries, extending into all lobes of both lungs. The RV/LV ratio is 2.0.  Outside CT Head W/O:  No bleeding or mass.  TTE 12/4:  LV normal in size with EF 60-65%. Normal wall motion. LA normal in size & RA normal in size. RV moderately dilated with moderately reduced systolic function. No aortic stenosis or regurgitation. Aortic root normal in size. Trivial mitral regurgitation without stenosis. No significant pulmonic regurgitation. Trivial tricuspid regurgitation. No pericardial effusion. Venous Duplex 12/4: DVT involving posterior tibial and peroneal veins of right lower extremity.  MICROBIOLOGY: MRSA PCR 12/4:  Negative   ANTIBIOTICS: None  SIGNIFICANT EVENTS: 12/04 - [facility]  LINES/TUBES: PIV  ASSESSMENT / PLAN:  HEMATOLOGIC A:   Acute Pulmonary Embolism w/ Acute Cor Pulmonale DVT Right Lower Extremity Anemia - Mild.  Leukocytosis - Resolved. Likely reactive demargination. H/O Dysfunctional Uterine Bleeding   P:  Trending cell counts daily w/ CBC Holding estrogen Heparin drip  Continuing  EKOS per IR  PULMONARY A: Acute submassive pulmonary emboli H/O COPD/Asthma  P:   Continue heparin gtt Supplemental oxygen as needed to maintain sats > 90% Consider EKOS once above complete Duoneb prn Holding Breo  CARDIOVASCULAR A:  Submassive PE - Syncopized & arrested at home? Acute Cor Pulmonale - Secondary to PE. H/O HTN  P:  Continuous telemetry monitoring Vitals per unit protocol Holding Lisinpril  RENAL A:   Dysuria - U/A negative on admit.  P:   Monitoring UOP Trending renal function & electrolytes daily  GASTROINTESTINAL A:   GI Bleeding - Colonoscopy showing hemorrhoids & diverticulosis.  H/O  IBS  P:   NPO  Protonix drip   continuing Holding home Prilosec  INFECTIOUS A:   No acute issues  P:   Monitoring for signs/symptoms of infection.  ENDOCRINE A:   Hyperglycemia - Mild.  P:   Monitor glucose on daily labs.  NEUROLOGIC A:   No acute issues H/O syringomyelia H/O Anxiety/Depression H/O Chronic Pain   P:   RASS goal: 0 Xanax po QHS Neurontin PO BID Home Atarax, Topamax, & Cymbalta  FAMILY  - Updates:  Patient updated 12/5 by Dr. Jamison NeighborNestor at bedside.   - Inter-disciplinary family meet or Palliative Care meeting due by:  12/11  TODAY'S SUMMARY:  45 y.o. female with submassive PE.  Patient with syncopal episode likely from clot burden. Continuing on lytic therapy. Appreciate assistance of IR. No evidence of any bleeding at this time.   I have spent a total of 33 minutes of critical care time today caring for the patient and reviewing the patient's electronic medical record.   Donna ChristenJennings E. Jamison NeighborNestor, M.D. [facility] Pager:  [phone] After 3pm or if no response, call [phone] 06/16/2016,  7:36 AM

## 2016-06-16 NOTE — Care Management Note (Signed)
Case Management Note  Patient Details  Name: Carrie Joyce MRN: [id number] Date of Birth: 09/23/1970  Subjective/Objective:   Pt admitted Acute Pulmonary Embolism w/ Acute Cor Pulmonale.  DVT Right Lower Extremity                Action/Plan:  PTA from home independent.    Expected Discharge Date:                  Expected Discharge Plan:  Home/Self Care  In-House Referral:     Discharge planning Services  CM Consult, Medication Assistance  Post Acute Care Choice:    Choice offered to:     DME Arranged:    DME Agency:     HH Arranged:    HH Agency:     Status of Service:  In process, will continue to follow  If discussed at Long Length of Stay Meetings, dates discussed:    Additional Comments: 06/16/2016 CM consulted by pharmacy for Eliquis and Xarelto cost post discharge.  CM informed pharmacy that since pt is not a recipient of Medicare nor medicaid - she will be eligible for both free 30 card and copay card for both medications (Eliquis $10 per month and Xarelto $0 per month) - pharmacy to follow back up with CM on which medication pt will discharge home on. Carrie Joyce, Carrie Lesinski S, RN 06/16/2016, 8:41 AM

## 2016-06-16 NOTE — Progress Notes (Signed)
ANTICOAGULATION CONSULT NOTE  Pharmacy Consult for heparin  Indication: pulmonary embolus  Allergies  Allergen Reactions  . Sulfa Antibiotics Hives and Swelling  . Codeine Nausea And Vomiting and Nausea Only  . Penicillins Hives  . Prochlorperazine Itching    Patient Measurements: Height: 5\' 4"  (162.6 cm) Weight: 270 lb 8.1 oz (122.7 kg) IBW/kg (Calculated) : 54.7 Heparin Dosing Weight:   Vital Signs: Temp: 98 F (36.7 C) (12/05 0725) Temp Source: Oral (12/05 0725) BP: 132/85 (12/05 0700) Pulse Rate: 91 (12/05 0700)  Labs:  Recent Labs  06/15/16 0433 06/15/16 0954 06/15/16 1454 06/15/16 1718 06/15/16 2117 06/16/16 0400  HGB 12.5  --   --   --  12.1 11.4*  HCT 38.1  --   --   --  37.3 34.7*  PLT 206  --   --   --  183 162  HEPARINUNFRC 0.79* 0.68  --  0.54 0.33 0.40  CREATININE 1.18*  --   --   --   --  1.13*  TROPONINI 1.28* 0.98* 0.59*  --   --   --     Estimated Creatinine Clearance: 82.1 mL/min (by C-G formula based on SCr of 1.13 mg/dL (H)).   Medical History: Past Medical History:  Diagnosis Date  . Anxiety   . Asthma   . Cervical spinal cord injury (HCC)   . COPD (chronic obstructive pulmonary disease) (HCC)   . Depression   . Essential hypertension   . Insomnia   . Low back pain   . Myalgia      Assessment: 45 yo female with acute submassive bilateral PE. ECHO showed R heart strain. Dopplers completed yesterday showed R lower extremity DVT. Due to severity of PE pt was evaluated by IR and was deemed to be a good candidate for EKOS. EKOS was started yesterday around 1800, pt received bilateral pulmonary catheters with catheter-directed tPA infusing > now completed. Heparin gtt is still running, heparin level was therapeutic this morning. Hgb 12.5 > 11.4, plts wnl.   Goal of Therapy:  Heparin level 0.3-0.7 units/ml Monitor platelets by anticoagulation protocol: Yes    Plan:  -Continue heparin at 1250 units/hr -Daily HL, CBC -F/u plan for  oral anticoagulation    Levoy Geisen, Darl HouseholderAlison M 06/16/2016,7:53 AM

## 2016-06-17 DIAGNOSIS — I2602 Saddle embolus of pulmonary artery with acute cor pulmonale: Secondary | ICD-10-CM

## 2016-06-17 DIAGNOSIS — E876 Hypokalemia: Secondary | ICD-10-CM

## 2016-06-17 DIAGNOSIS — J9601 Acute respiratory failure with hypoxia: Secondary | ICD-10-CM

## 2016-06-17 LAB — CBC WITH DIFFERENTIAL/PLATELET
BASOS ABS: 0 10*3/uL (ref 0.0–0.1)
BASOS PCT: 0 %
Eosinophils Absolute: 0.2 10*3/uL (ref 0.0–0.7)
Eosinophils Relative: 3 %
HEMATOCRIT: 33.4 % — AB (ref 36.0–46.0)
HEMOGLOBIN: 11 g/dL — AB (ref 12.0–15.0)
Lymphocytes Relative: 40 %
Lymphs Abs: 3 10*3/uL (ref 0.7–4.0)
MCH: 28.4 pg (ref 26.0–34.0)
MCHC: 32.9 g/dL (ref 30.0–36.0)
MCV: 86.3 fL (ref 78.0–100.0)
MONOS PCT: 6 %
Monocytes Absolute: 0.5 10*3/uL (ref 0.1–1.0)
NEUTROS ABS: 3.9 10*3/uL (ref 1.7–7.7)
NEUTROS PCT: 51 %
Platelets: 168 10*3/uL (ref 150–400)
RBC: 3.87 MIL/uL (ref 3.87–5.11)
RDW: 13.6 % (ref 11.5–15.5)
WBC: 7.6 10*3/uL (ref 4.0–10.5)

## 2016-06-17 LAB — RENAL FUNCTION PANEL
ALBUMIN: 2.5 g/dL — AB (ref 3.5–5.0)
ANION GAP: 5 (ref 5–15)
BUN: 5 mg/dL — ABNORMAL LOW (ref 6–20)
CO2: 20 mmol/L — ABNORMAL LOW (ref 22–32)
Calcium: 7.9 mg/dL — ABNORMAL LOW (ref 8.9–10.3)
Chloride: 113 mmol/L — ABNORMAL HIGH (ref 101–111)
Creatinine, Ser: 1.04 mg/dL — ABNORMAL HIGH (ref 0.44–1.00)
GFR calc non Af Amer: >60 mL/min (ref >60)
GLUCOSE: 121 mg/dL — AB (ref 65–99)
PHOSPHORUS: 3.3 mg/dL (ref 2.5–4.6)
POTASSIUM: 3.4 mmol/L — AB (ref 3.5–5.1)
Sodium: 138 mmol/L (ref 135–145)

## 2016-06-17 LAB — HEPARIN LEVEL (UNFRACTIONATED): Heparin Unfractionated: 0.43 IU/mL (ref 0.30–0.70)

## 2016-06-17 LAB — MAGNESIUM: MAGNESIUM: 2 mg/dL (ref 1.7–2.4)

## 2016-06-17 MED ORDER — PANTOPRAZOLE SODIUM 40 MG PO TBEC
40.0000 mg | DELAYED_RELEASE_TABLET | Freq: Two times a day (BID) | ORAL | Status: DC
  Administered 2016-06-17 – 2016-06-18 (×3): 40 mg via ORAL
  Filled 2016-06-17 (×3): qty 1

## 2016-06-17 MED ORDER — SODIUM CHLORIDE 0.9 % IV SOLN
30.0000 meq | Freq: Once | INTRAVENOUS | Status: AC
  Administered 2016-06-17: 30 meq via INTRAVENOUS
  Filled 2016-06-17: qty 15

## 2016-06-17 MED ORDER — ACETAMINOPHEN 325 MG PO TABS
650.0000 mg | ORAL_TABLET | Freq: Four times a day (QID) | ORAL | Status: DC | PRN
  Administered 2016-06-17: 650 mg via ORAL
  Filled 2016-06-17: qty 2

## 2016-06-17 NOTE — Progress Notes (Signed)
ANTICOAGULATION CONSULT NOTE  Pharmacy Consult for heparin  Indication: pulmonary embolus  Allergies  Allergen Reactions  . Sulfa Antibiotics Hives and Swelling  . Codeine Nausea And Vomiting and Nausea Only  . Penicillins Hives  . Prochlorperazine Itching    Patient Measurements: Height: 5\' 4"  (162.6 cm) Weight: 270 lb 15.1 oz (122.9 kg) IBW/kg (Calculated) : 54.7 Heparin Dosing Weight:   Vital Signs: Temp: 98.6 F (37 C) (12/06 1113) Temp Source: Oral (12/06 1113) BP: 118/86 (12/06 1000) Pulse Rate: 96 (12/06 1100)  Labs:  Recent Labs  06/15/16 0433 06/15/16 0954 06/15/16 1454  06/16/16 0400 06/16/16 1119 06/16/16 1302 06/17/16 0439 06/17/16 1034  HGB 12.5  --   --   < > 11.4* 11.3* 11.0* 11.0*  --   HCT 38.1  --   --   < > 34.7* 35.0* 33.6* 33.4*  --   PLT 206  --   --   < > 162 156 162 168  --   HEPARINUNFRC 0.79* 0.68  --   < > 0.40 0.43 0.31  --  0.43  CREATININE 1.18*  --   --   --  1.13*  --   --  1.04*  --   TROPONINI 1.28* 0.98* 0.59*  --   --   --   --   --   --   < > = values in this interval not displayed.  Estimated Creatinine Clearance: 89.4 mL/min (by C-G formula based on SCr of 1.04 mg/dL (H)).   Medical History: Past Medical History:  Diagnosis Date  . Anxiety   . Asthma   . Cervical spinal cord injury (HCC)   . COPD (chronic obstructive pulmonary disease) (HCC)   . Depression   . Essential hypertension   . Insomnia   . Low back pain   . Myalgia      Assessment: 45 yo female with acute submassive bilateral PE. ECHO showed R heart strain. Dopplers completed yesterday showed R lower extremity DVT. Due to severity of PE pt was evaluated by IR and was deemed to be a good candidate for EKOS. EKOS was done and completed yesterday morning. Remains on heparin infusion, heparin level was therapeutic this morning. Hgb 11, plts wnl.   Goal of Therapy:  Heparin level 0.3-0.7 units/ml Monitor platelets by anticoagulation protocol: Yes     Plan:  -Continue heparin at 1250 units/hr -Daily HL, CBC -Plan PO anticoagulation 12/7 -See case management note for cost of DOACs     Agapito GamesAlison Marchetta Navratil, PharmD, BCPS Clinical Pharmacist 06/17/2016 12:11 PM

## 2016-06-17 NOTE — Progress Notes (Signed)
Transferred pt to 3E08, belongings placed at bedside. Pt VS stable prior to transfer.

## 2016-06-17 NOTE — Progress Notes (Signed)
PULMONARY / CRITICAL CARE MEDICINE   Name: Carrie Joyce MRN: [id number] DOB: [birth year]    ADMISSION DATE:  06/15/2016 CONSULTATION DATE:    Carrie Carbon MD:  Duke Salvia  CHIEF COMPLAINT:  Shortness of breath, syncopal  HISTORY OF PRESENT ILLNESS:   Carrie Joyce is a 45F with PMH significant for anxiety, syringomyelia, dysfunctional uterine bleeding (on megace), IBS, interstitial cystitis and obesity who presents as a transfer from [facility] for submassive PE. She has been feeling weak and complaining of bilateral leg discomfort and exertional dyspnea for about 10 days. It had gotten to the point that she was mostly staying in bed with a heating pad on her legs. When she got up to ride to the store with her daughter, she had a syncopal episode. EMS was called and her family initiated CPR. When EMS arrived, she had a pulse. She was placed on oxygen and transported. CTA chest at [facility] showed large clot burden in bilateral pulmonary arteries extending into all lobes with RV:LV ratio of 2. Troponin was mildly elevated at 0.32, ECG showed S1,Q3,T3 pattern. BNP not obtained. Mild leukocytosis.  She reports that she has been on estrogen for dysfunctional uterine bleeding for the past 6 months or so. No recent long travel or periods of immobility. No prior history of blood clots. She thinks her grandmother may have had a blood clot, but is unsure of the circumstances. She is up to date on appropriate screening (mammography) and had a recent colonoscopy for other indications that was also largely benign.  Currently she endorses chest pain across the front of her chest, shortness of breath, anxiety and some mild leg/calf tightness R > L. She denies hemoptysis, cough, sputum, pleuritic pain, palpitations.   SUBJECTIVE:  Completed EKOS yesterday. Reports improved dyspnea and chest pain/pressure. No new coughing.    REVIEW OF SYSTEMS:  No abdominal pain or nausea. No hematochezia or bowel movement. No  hematuria. No fever or chills. No headaches.   VITAL SIGNS: BP 117/81 (BP Location: Right Arm)   Pulse 88   Temp 98.5 F (36.9 C) (Oral)   Resp (!) 21   Ht 5\' 4"  (1.626 m)   Wt 270 lb 15.1 oz (122.9 kg)   SpO2 96%   BMI 46.51 kg/m   HEMODYNAMICS:    VENTILATOR SETTINGS:    INTAKE / OUTPUT: I/O last 3 completed shifts: In: 3108.6 [P.O.:410; I.V.:2698.6] Out: 2460 [Urine:2460]  PHYSICAL EXAMINATION: General:  Awake. No distress. No family at bedside. Patient with purse in her bed.  Integument:  Warm & dry. No rash on exposed skin.  HEENT:  Moist mucus membranes. No scleral injection or icterus.  Cardiovascular:  Regular rate and rhythm. No appreciable JVD.  Pulmonary:  Clear with auscultation bilaterally. Normal work of breathing on room air. Abdomen: Soft. Normal bowel sounds. Protuberant. Musculoskeletal:  Normal bulk and tone. No joint deformity or effusion appreciated. Neurological:  CN 2-12 in tact. No meningismus. Following commands. A&Ox4.   LABS:  BMET  Recent Labs Lab 06/15/16 0433 06/16/16 0400 06/17/16 0439  NA 135 137 138  K 4.5 3.7 3.4*  CL 108 112* 113*  CO2 20* 17* 20*  BUN 7 7 5*  CREATININE 1.18* 1.13* 1.04*  GLUCOSE 141* 97 121*    Electrolytes  Recent Labs Lab 06/15/16 0433 06/16/16 0400 06/17/16 0439  CALCIUM 8.3* 8.0* 7.9*  MG 2.2 2.1 2.0  PHOS 3.8 3.7 3.3    CBC  Recent Labs Lab 06/16/16 1119 06/16/16 1302 06/17/16  0439  WBC 9.7 8.9 7.6  HGB 11.3* 11.0* 11.0*  HCT 35.0* 33.6* 33.4*  PLT 156 162 168    Coag's No results for input(s): APTT, INR in the last 168 hours.  Sepsis Markers No results for input(s): LATICACIDVEN, PROCALCITON, O2SATVEN in the last 168 hours.  ABG No results for input(s): PHART, PCO2ART, PO2ART in the last 168 hours.  Liver Enzymes  Recent Labs Lab 06/16/16 0400 06/17/16 0439  ALBUMIN 2.8* 2.5*    Cardiac Enzymes  Recent Labs Lab 06/15/16 0433 06/15/16 0954 06/15/16 1454   TROPONINI 1.28* 0.98* 0.59*    Glucose  Recent Labs Lab 06/15/16 0215 06/15/16 1614  GLUCAP 142* 97    Imaging Ir Rande Lawmanhromb F/u Eval Art/ven Final Day (ms)  Result Date: 06/16/2016 CLINICAL DATA:  Sub massive bilateral pulmonary emboli, status post bilateral catheter directed ultrasound assisted overnight thrombolysis. EXAM: IR THROMB F/U EVAL ART/VEN FINAL DAY ANESTHESIA/SEDATION: None MEDICATIONS: None CONTRAST:  None PROCEDURE: Pressure measurements were obtained from the right pulmonary arterial catheter bedside in the ICU. Both pulmonary arterial infusion catheters and venous sheaths were removed and hemostasis achieved at the sites with manual compression. COMPLICATIONS: None immediate FINDINGS: The right pulmonary arterial pressure is 28/17(mean 20) mmHg after overnight thrombolytic infusion. Preop pressures 40/23(30). IMPRESSION: 1. Significant interval improvement in pulmonary arterial hypertension post overnight ultrasound assisted catheter directed pulmonary arterial thrombolytic infusion. Electronically Signed   By: Corlis Leak  Hassell M.D.   On: 06/16/2016 10:39     STUDIES:  Outside CTA Chest: Pulmonary embolus noted within both main pulmonary arteries, extending into all lobes of both lungs. The RV/LV ratio is 2.0.  Outside CT Head W/O:  No bleeding or mass.  TTE 12/4:  LV normal in size with EF 60-65%. Normal wall motion. LA normal in size & RA normal in size. RV moderately dilated with moderately reduced systolic function. No aortic stenosis or regurgitation. Aortic root normal in size. Trivial mitral regurgitation without stenosis. No significant pulmonic regurgitation. Trivial tricuspid regurgitation. No pericardial effusion. Venous Duplex 12/4: DVT involving posterior tibial and peroneal veins of right lower extremity.  MICROBIOLOGY: MRSA PCR 12/4:  Negative   ANTIBIOTICS: None  SIGNIFICANT EVENTS: 12/04 - [place] [facility] [place] 12/04 - EKOS>>ended 12/5 & catheters  removed   LINES/TUBES: PIV  ASSESSMENT / PLAN:  HEMATOLOGIC A:   Acute Pulmonary Embolism w/ Acute Cor Pulmonale - S/P EKOS 12/4 - 12/5. DVT Right Lower Extremity Anemia - Mild. Hgb stable. Leukocytosis - Resolved. Likely reactive demargination. H/O Dysfunctional Uterine Bleeding   P:  Trending cell counts daily w/ CBC Holding estrogen Heparin drip  Continuing  Plan to initiate oral anticoagulant tomorrow  PULMONARY A: Acute Pulmonary Embolism w/ Acute Cor Pulmonale - S/P tx w/ EKOS. Acute Hypoxic Respiratory Failure - Secondary to PE. H/O COPD/Asthma  P:   Weaning FiO2 for Sat > 90% Duoneb prn Holding Breo Plan for F/U with Pulmonary after discharge  CARDIOVASCULAR A:  Submassive PE - Syncopized & arrested at home? Acute Cor Pulmonale - Secondary to PE. H/O HTN  P:  Continuous telemetry monitoring Vitals per unit protocol Holding Lisinpril Plan to repeat Complete Echocardiogram in 2-3 months to re-evaluate RV function   RENAL A:   Hypokalemia - Replaced.  Dysuria - U/A negative on admit.  P:   Monitoring UOP Trending renal function & electrolytes daily Replacing electrolytes as indicated KCl 30mEq IV x1  GASTROINTESTINAL A:   GI Bleeding - Colonoscopy showing hemorrhoids & diverticulosis. No signs  of bleeding. H/O  IBS  P:   Advance to Heart Health/Carb Modified Diet D/C Protonix drip Starting Protonix 40mg  PO bid Holding home Prilosec  INFECTIOUS A:   No acute issues  P:   Monitoring for signs/symptoms of infection.  ENDOCRINE A:   Hyperglycemia - Mild.  P:   Monitor glucose on daily labs.  NEUROLOGIC A:   No acute issues H/O syringomyelia H/O Anxiety/Depression H/O Chronic Pain   P:   RASS goal: 0 Xanax po QHS Neurontin PO BID Home Atarax, Topamax, & Cymbalta  FAMILY  - Updates:  Patient updated 12/6 by Dr. Jamison NeighborNestor at bedside.   - Inter-disciplinary family meet or Palliative Care meeting due by:  12/11  TODAY'S  SUMMARY:  45 y.o. female with submassive PE.  Patient with syncopal episode likely from clot burden. Patient has completed EKOS thrombolytic therapy on 12/5 and continues on systemic anticoagulation with heparin. Can likely transition to oral anticoagulation tomorrow. Replacing potassium for hypokalemia. Given clinical improvement & stability plan to transfer to Telemetry bed for further monitoring.  TRH to assume care & PCCM off as of 12/7.   Donna ChristenJennings E. Jamison NeighborNestor, M.D. [facility] Pager:  [phone] After 3pm or if no response, call [phone] 06/17/2016, 7:42 AM

## 2016-06-17 NOTE — Progress Notes (Signed)
New Transfer Note:   Arrival Method: Bed Mental Orientation: A&OX4 Telemetry:3E32 Assessment: Completed Skin: Intact IV: R forearm, R wrist and left hand IV present Pain:5/10,pt stated that she does not need pain medication Tubes: None Safety Measures: Safety Fall Prevention Plan has been discussed  Admission:  [facility] [place] Orientation: Patient has been orientated to the room, unit and staff.  Family: None at bedside  Patients vital signs stable at this moment.  No complains or concerns at this moment. Belonging at bedside, noting valuable per patient.   Orders to be reviewed and implemented. Will continue to monitor the patient. Call light has been placed within reach and bed alarm has been activated.   Merik Mignano, RN Phone: [phone]

## 2016-06-17 NOTE — Progress Notes (Signed)
[facility] FOR [facility] LAB REPLACEMENT ONLY  The patient does apply [facility] the [facility] Protocol based on the criteria listed below:   1. Is GFR >/= 40 ml/min? Yes.    Patient's GFR today is >60 2. Is urine output >/= 0.5 ml/kg/hr for the last 6 hours? Yes.   Patient's UOP is 1.09 ml/kg/hr 3. Is BUN < 60 mg/dL? Yes.    Patient's BUN today is 5 4. Abnormal electrolyte(s): K3.4 5. Ordered repletion with: per protocol  6. If a panic level lab has been reported, has the CCM MD in charge been notified? Yes.  .   Physician:  Laural BenesS Sommer, MD  Melrose NakayamaChisholm, Cameo Shewell William 06/17/2016 6:20 AM

## 2016-06-17 NOTE — Progress Notes (Signed)
Supervising Physician: Malachy MoanMcCullough, Heath  [facility]  [facility]  Chief Complaint: Submassive PE with right heart strain  Subjective: S/p successful PE lysis, mean PA pressures decreased from 30 to 20. Pt resting in bed. Denies SOB, hasn't been OOB yet today  Allergies: Sulfa antibiotics; Codeine; Penicillins; and Prochlorperazine  Medications:  Current Facility-Administered Medications:  .  0.9 %  sodium chloride infusion, 250 mL, Intravenous, PRN, Orville GovernSarah Ellen Stephens, MD, Last Rate: 10 mL/hr at 06/15/16 0430, 250 mL at 06/15/16 0430 .  acetaminophen (TYLENOL) tablet 650 mg, 650 mg, Oral, Q6H PRN, Roslynn AmbleJennings E Nestor, MD .  ALPRAZolam Prudy Feeler(XANAX) tablet 1 mg, 1 mg, Oral, QHS PRN, Leslye Peerobert S Byrum, MD, 1 mg at 06/15/16 2313 .  DULoxetine (CYMBALTA) DR capsule 120 mg, 120 mg, Oral, Daily, Leslye Peerobert S Byrum, MD, 120 mg at 06/16/16 0933 .  gabapentin (NEURONTIN) capsule 800 mg, 800 mg, Oral, BID, Orville GovernSarah Ellen Stephens, MD, 800 mg at 06/16/16 2200 .  heparin ADULT infusion 100 units/mL (25000 units/21750mL sodium chloride 0.45%), 1,250 Units/hr, Intravenous, Continuous, Darl Householderlison M Masters, RPH, Last Rate: 12.5 mL/hr at 06/16/16 1610, 1,250 Units/hr at 06/16/16 1610 .  hydrOXYzine (ATARAX/VISTARIL) tablet 25 mg, 25 mg, Oral, QHS, Leslye Peerobert S Byrum, MD, 25 mg at 06/16/16 2147 .  ipratropium-albuterol (DUONEB) 0.5-2.5 (3) MG/3ML nebulizer solution 3 mL, 3 mL, Nebulization, Q6H PRN, Lupita Leashouglas B McQuaid, MD, 3 mL at 06/15/16 0913 .  MEDLINE mouth rinse, 15 mL, Mouth Rinse, BID, Leslye Peerobert S Byrum, MD, 15 mL at 06/16/16 2148 .  ondansetron (ZOFRAN) injection 4 mg, 4 mg, Intravenous, Q6H PRN, Roslynn AmbleJennings E Nestor, MD, 4 mg at 06/15/16 1541 .  pantoprazole (PROTONIX) EC tablet 40 mg, 40 mg, Oral, BID, Roslynn AmbleJennings E Nestor, MD .  potassium chloride 30 mEq in sodium chloride 0.9 % 265 mL (KCL MULTIRUN) IVPB, 30 mEq, Intravenous, Once, Karl ItoSteven E Sommer, MD, 30 mEq at 06/17/16 0721 .  topiramate (TOPAMAX) tablet 100 mg,  100 mg, Oral, QHS, Leslye Peerobert S Byrum, MD, 100 mg at 06/16/16 2148    Vital Signs: BP (!) 150/80 (BP Location: Right Arm)   Pulse 89   Temp 98.5 F (36.9 C) (Oral)   Resp (!) 22   Ht 5\' 4"  (1.626 m)   Wt 270 lb 15.1 oz (122.9 kg)   SpO2 99%   BMI 46.51 kg/m   Physical Exam (R)neck IJ access site soft, NT, no hematoma Lungs: CTA Heart: Reg  Imaging:  Ir Rande Lawmanhromb F/u Eval Art/ven Final Day (ms)  Result Date: 06/16/2016 CLINICAL DATA:  Sub massive bilateral pulmonary emboli, status post bilateral catheter directed ultrasound assisted overnight thrombolysis. EXAM: IR THROMB F/U EVAL ART/VEN FINAL DAY ANESTHESIA/SEDATION: None MEDICATIONS: None CONTRAST:  None PROCEDURE: Pressure measurements were obtained from the right pulmonary arterial catheter bedside in the ICU. Both pulmonary arterial infusion catheters and venous sheaths were removed and hemostasis achieved at the sites with manual compression. COMPLICATIONS: None immediate FINDINGS: The right pulmonary arterial pressure is 28/17(mean 20) mmHg after overnight thrombolytic infusion. Preop pressures 40/23(30). IMPRESSION: 1. Significant interval improvement in pulmonary arterial hypertension post overnight ultrasound assisted catheter directed pulmonary arterial thrombolytic infusion. Electronically Signed   By: Corlis Leak  Hassell M.D.   On: 06/16/2016 10:39    Labs:  CBC:  Recent Labs  06/16/16 0400 06/16/16 1119 06/16/16 1302 06/17/16 0439  WBC 8.9 9.7 8.9 7.6  HGB 11.4* 11.3* 11.0* 11.0*  HCT 34.7* 35.0* 33.6* 33.4*  PLT 162 156 162 168  COAGS: No results for input(s): INR, APTT in the last 8760 hours.  BMP:  Recent Labs  06/15/16 0433 06/16/16 0400 06/17/16 0439  NA 135 137 138  K 4.5 3.7 3.4*  CL 108 112* 113*  CO2 20* 17* 20*  GLUCOSE 141* 97 121*  BUN 7 7 5*  CALCIUM 8.3* 8.0* 7.9*  CREATININE 1.18* 1.13* 1.04*  GFRNONAA 55* 58* >60  GFRAA >60 >60 >60    LIVER FUNCTION TESTS:  Recent Labs   06/16/16 0400 06/17/16 0439  ALBUMIN 2.8* 2.5*    Assessment and Plan: PE with right heart strain S/p EKOS PE lysis with drop in mean PA pressure Continue hep until transition to po anticoagulation   Electronically Signed: Brayton ElBRUNING, Kohen Reither 06/17/2016, 9:24 AM   I spent a total of 15 Minutes at the the patient's bedside AND on the patient's hospital floor or unit, greater than 50% of which was counseling/coordinating care for PE lysis

## 2016-06-18 ENCOUNTER — Encounter (HOSPITAL_COMMUNITY): Payer: Self-pay

## 2016-06-18 DIAGNOSIS — I82401 Acute embolism and thrombosis of unspecified deep veins of right lower extremity: Secondary | ICD-10-CM | POA: Diagnosis present

## 2016-06-18 DIAGNOSIS — R55 Syncope and collapse: Secondary | ICD-10-CM | POA: Diagnosis present

## 2016-06-18 DIAGNOSIS — J9601 Acute respiratory failure with hypoxia: Secondary | ICD-10-CM | POA: Diagnosis present

## 2016-06-18 LAB — CBC
HCT: 36.4 % (ref 36.0–46.0)
Hemoglobin: 11.9 g/dL — ABNORMAL LOW (ref 12.0–15.0)
MCH: 28.1 pg (ref 26.0–34.0)
MCHC: 32.7 g/dL (ref 30.0–36.0)
MCV: 86.1 fL (ref 78.0–100.0)
PLATELETS: 198 10*3/uL (ref 150–400)
RBC: 4.23 MIL/uL (ref 3.87–5.11)
RDW: 13.2 % (ref 11.5–15.5)
WBC: 9.3 10*3/uL (ref 4.0–10.5)

## 2016-06-18 LAB — RENAL FUNCTION PANEL
ALBUMIN: 2.9 g/dL — AB (ref 3.5–5.0)
Anion gap: 7 (ref 5–15)
BUN: 6 mg/dL (ref 6–20)
CALCIUM: 8.4 mg/dL — AB (ref 8.9–10.3)
CO2: 20 mmol/L — ABNORMAL LOW (ref 22–32)
CREATININE: 1.02 mg/dL — AB (ref 0.44–1.00)
Chloride: 111 mmol/L (ref 101–111)
GFR calc Af Amer: >60 mL/min (ref >60)
Glucose, Bld: 109 mg/dL — ABNORMAL HIGH (ref 65–99)
PHOSPHORUS: 3.4 mg/dL (ref 2.5–4.6)
Potassium: 3.6 mmol/L (ref 3.5–5.1)
Sodium: 138 mmol/L (ref 135–145)

## 2016-06-18 LAB — HEPARIN LEVEL (UNFRACTIONATED)
HEPARIN UNFRACTIONATED: 0.29 [IU]/mL — AB (ref 0.30–0.70)
HEPARIN UNFRACTIONATED: 0.42 [IU]/mL (ref 0.30–0.70)

## 2016-06-18 LAB — MAGNESIUM: MAGNESIUM: 2.1 mg/dL (ref 1.7–2.4)

## 2016-06-18 MED ORDER — RIVAROXABAN (XARELTO) VTE STARTER PACK (15 & 20 MG): ORAL_TABLET | ORAL | 0 refills | Status: DC

## 2016-06-18 MED ORDER — LINACLOTIDE 145 MCG PO CAPS: 145.0000 ug | ORAL_CAPSULE | Freq: Every day | ORAL | Status: DC

## 2016-06-18 NOTE — Discharge Summary (Addendum)
Physician Discharge Summary  Carrie Joyce ZOX:[id number] DOB: [birth year] DOA: 06/15/2016  PCP: No PCP Per Patient  Admit date: 06/15/2016 Discharge date: 06/18/2016  Admitted From: Home --> [facility] --> [facility]  Disposition:  Home  Recommendations for Outpatient Follow-up:  1. Follow up with PCP in 1 week. Will give info for [facility] on discharge.  2. Please obtain repeat echocardiogram in 2-3 months to evaluate RV function  3. Please obtain BMP/CBC in one week.  4. Take Xarelto as prescribed.  5. Stop megestrol as this can increase risk of blood clot.   Home Health: HH PT OT Equipment/Devices: 3n1   Discharge Condition: Stable CODE STATUS: Full  Diet recommendation: general   Brief/Interim Summary: From H&P: Carrie Joyce is a 15F with PMH significant for anxiety, syringomyelia, dysfunctional uterine bleeding (on megace), IBS, interstitial cystitis and obesity who presents as a transfer from [facility] for submassive PE. She has been feeling weak and complaining of bilateral leg discomfort and exertional dyspnea for about 10 days. It had gotten to the point that she was mostly staying in bed with a heating pad on her legs. When she got up to ride to the store with her daughter, she had a syncopal episode. EMS was called and her family initiated CPR. When EMS arrived, she had a pulse. She was placed on oxygen and transported. CTA chest at [facility] showed large clot burden in bilateral pulmonary arteries extending into all lobes with RV:LV ratio of 2. Troponin was mildly elevated at 0.32, ECG showed S1,Q3,T3 pattern.   She reports that she has been on estrogen for dysfunctional uterine bleeding for the past 6 months or so. No recent long travel or periods of immobility. No prior history of blood clots. She thinks her grandmother may have had a blood clot, but is unsure of the circumstances. She is up to date on appropriate screening (mammography) and had a recent  colonoscopy for other indications that was also largely benign.  Currently she endorses chest pain across the front of her chest, shortness of breath, anxiety and some mild leg/calf tightness R > L. She denies hemoptysis, cough, sputum, pleuritic pain, palpitations.   Interim: She was admitted under PCCM care, treated with heparin gtt. She underwent EKOS with PE lysis on 12/4.   Subjective on day of discharge: Continues to be short of breath with ambulation, but doing well at rest. Has some soreness in right leg. No chest pain, SOB at rest, n/v,   Discharge Diagnoses:  Principal Problem:   Pulmonary emboli (HCC) Active Problems:   DUB (dysfunctional uterine bleeding)   Right leg DVT (HCC)   Acute respiratory failure with hypoxia (HCC)   Syncope and collapse   Submassive PE with right heart strain S/p EKOS PE lysis with drop in mean PA pressure 12/4 Stop megestrol (takes for dysfunctional uterine bleed)  Heparin gtt will be transitioned to Xarelto on discharge  Repeat echo in 2-3 months to re-evaluate RV function   Right DVT Heparin gtt will be transitioned to Xarelto   Elevated troponin Due to above, right heart strain, trended down   Acute hypoxemic respiratory failure Due to above, now off O2   Syncope vs cardiac arrest  Due to clot burden as above   Discharge Instructions  Discharge Instructions    Diet - low sodium heart healthy    Complete by:  As directed    Discharge instructions    Complete by:  As directed  Recommendations for Outpatient Follow-up:  Follow up with PCP in 1 week Please obtain repeat echocardiogram in 2-3 months to evaluate RV function  Please obtain BMP/CBC in one week.  Take Xarelto as prescribed.  Stop megestrol as this can increase risk of blood clot.   Increase activity slowly    Complete by:  As directed        Medication List    STOP taking these medications   megestrol 40 MG tablet Commonly known as:  MEGACE     TAKE these  medications   albuterol 108 (90 Base) MCG/ACT inhaler Commonly known as:  PROVENTIL HFA;VENTOLIN HFA Inhale 2 puffs into the lungs every 6 (six) hours as needed for wheezing or shortness of breath.   ALPRAZolam 1 MG tablet Commonly known as:  XANAX Take 1 mg by mouth at bedtime as needed for anxiety or sleep.   baclofen 10 MG tablet Commonly known as:  LIORESAL Take 5-10 mg by mouth 2 (two) times daily as needed for muscle spasms. Reported on 10/04/2015   BIOFREEZE EX Apply 1 application topically as needed (for pain).   BREO ELLIPTA 100-25 MCG/INH Aepb Generic drug:  fluticasone furoate-vilanterol Inhale 1 puff into the lungs daily.   DULoxetine 60 MG capsule Commonly known as:  CYMBALTA Take 120 mg by mouth every morning.   gabapentin 800 MG tablet Commonly known as:  NEURONTIN Take 800 mg by mouth 2 (two) times daily.   hydrOXYzine 25 MG tablet Commonly known as:  ATARAX/VISTARIL Take 25 mg by mouth at bedtime.   LINZESS 145 MCG Caps capsule Generic drug:  linaclotide Take 145 mcg by mouth at bedtime.   lisinopril 10 MG tablet Commonly known as:  PRINIVIL,ZESTRIL Take 10 mg by mouth daily.   omeprazole 20 MG capsule Commonly known as:  PRILOSEC Take 20 mg by mouth daily as needed (for acid reflux).   promethazine 25 MG tablet Commonly known as:  PHENERGAN Take 25 mg by mouth every 6 (six) hours as needed for nausea or vomiting.   Rivaroxaban 15 & 20 MG Tbpk Start with one 15mg  tablet by mouth twice a day with food. On Day 22, switch to one 20mg  tablet once a day with food.   rizatriptan 10 MG tablet Commonly known as:  MAXALT Take 10 mg by mouth as needed for migraine. May repeat in 2 hours if needed   THERA Tabs Take 1 tablet by mouth daily.   topiramate 50 MG tablet Commonly known as:  TOPAMAX Take 100 mg by mouth at bedtime.      Follow-up Information    [facility].   Specialty:  Internal Medicine Why:  Office will call  patient with hospital follow up appt.  Contact information: [address] [place] [address] [phone]         Allergies  Allergen Reactions  . Sulfa Antibiotics Hives and Swelling  . Codeine Nausea And Vomiting and Nausea Only  . Penicillins Hives  . Prochlorperazine Itching    Consultations:  PCCM  IR for EKOS    Procedures/Studies: Ir Angiogram Pulmonary Bilateral Selective  Result Date: 06/16/2016 INDICATION: Sub massive pulmonary emboli with right heart strain, elevated troponins, shortness of breath. EXAM: 1. ULTRASOUND GUIDANCE FOR VENOUS ACCESS X2 2. FLUOROSCOPIC GUIDED PLACEMENT OF BILATERAL PULMONARY ARTERIAL LYTIC INFUSION CATHETERS COMPARISON:  Chest CTA - 02/05/2014 MEDICATIONS: Versed 3 mg IV; Fentanyl 100 mcg IV Sedation time: 60 minutes FLUOROSCOPY TIME:  10 minutes 54  seconds. COMPLICATIONS: None immediate TECHNIQUE: Informed written consent was obtained from the patient after a discussion of the risks, benefits and alternatives to treatment. Questions regarding the procedure were encouraged and answered. A timeout was performed prior to the initiation of the procedure. Because of known lower extremity DVT, jugular approach was selected for vascular access. Patency of the right IJ vein confirmed with ultrasound. The right neck was prepped and draped in the usual sterile fashion, and a sterile drape was applied covering the operative field. Maximum barrier sterile technique with sterile gowns and gloves were used for the procedure. A timeout was performed prior to the initiation of the procedure. Local anesthesia was provided with 1% lidocaine. Under direct ultrasound guidance, the right internal jugular vein was accessed with a micro puncture transitional dilator, dilated with a 6 French vascular sheath. With the use of a glidewire, an angled pigtail catheter was advanced into the right main pulmonary artery . Pressure measurements were then obtained  from the right pulmonary artery. Catheter exchanged for the vascular sheath through which a 105/18 cm multi side-hole EKOS ultrasound assisted infusion catheter was positioned extending into lower lobe branch of the right pulmonary artery. Slightly caudal to this initial access, the right internal jugular vein was again accessed with a micropuncture set, ultimately allowing placement of a 7 French angled pigtail catheter, advanced into the left pulmonary artery, exchanged for the 6 [place] vascular sheath through which a 105/12 cm multi side-hole EKOS ultrasound assisted infusion catheter was positioned extending into lower lobe branch of the right pulmonary artery. A postprocedural fluoroscopic image was obtained of the check demonstrating final catheter positioning. Both vascular sheaths were secured at the right neck with 0 silk sutures. The external catheter tubing was secured at the right chest and the lytic therapy was initiated. The patient tolerated the procedure well without immediate postprocedural complication. FINDINGS: Elevated pulmonary arterial pressure, Right main pulmonary artery - 40/23  mean (normal: < 25/10). Following the procedure, both ultrasound assisted infusion catheter tips terminate within the distal aspects of the bilateral lower lobe sub segmental pulmonary arteries. Ultrasound assisted catheter directed bilateral pulmonary arterial thrombolysis was initiated. IMPRESSION: 1. Successful fluoroscopic guided initiation of bilateral ultrasound assisted catheter directed pulmonary arterial lysis for sub massive pulmonary embolism and right-sided heart strain. 2. Markedly elevated pressure measurements within the right main pulmonary artery compatible with critical pulmonary arterial hypertension. PLAN: Overnight ICU observation with follow-up pressure measurements in the morning. Electronically Signed   By: Corlis Leak M.D.   On: 06/16/2016 10:18   Ir Angiogram Selective Each  Additional Vessel  Result Date: 06/16/2016 INDICATION: Sub massive pulmonary emboli with right heart strain, elevated troponins, shortness of breath. EXAM: 1. ULTRASOUND GUIDANCE FOR VENOUS ACCESS X2 2. FLUOROSCOPIC GUIDED PLACEMENT OF BILATERAL PULMONARY ARTERIAL LYTIC INFUSION CATHETERS COMPARISON:  Chest CTA - 02/05/2014 MEDICATIONS: Versed 3 mg IV; Fentanyl 100 mcg IV Sedation time: 60 minutes FLUOROSCOPY TIME:  10 minutes 54 seconds. COMPLICATIONS: None immediate TECHNIQUE: Informed written consent was obtained from the patient after a discussion of the risks, benefits and alternatives to treatment. Questions regarding the procedure were encouraged and answered. A timeout was performed prior to the initiation of the procedure. Because of known lower extremity DVT, jugular approach was selected for vascular access. Patency of the right IJ vein confirmed with ultrasound. The right neck was prepped and draped in the usual sterile fashion, and a sterile drape was applied covering the operative field. Maximum barrier sterile technique with sterile  gowns and gloves were used for the procedure. A timeout was performed prior to the initiation of the procedure. Local anesthesia was provided with 1% lidocaine. Under direct ultrasound guidance, the right internal jugular vein was accessed with a micro puncture transitional dilator, dilated with a 6 French vascular sheath. With the use of a glidewire, an angled pigtail catheter was advanced into the right main pulmonary artery . Pressure measurements were then obtained from the right pulmonary artery. Catheter exchanged for the vascular sheath through which a 105/18 cm multi side-hole EKOS ultrasound assisted infusion catheter was positioned extending into lower lobe branch of the right pulmonary artery. Slightly caudal to this initial access, the right internal jugular vein was again accessed with a micropuncture set, ultimately allowing placement of a 7 French angled  pigtail catheter, advanced into the left pulmonary artery, exchanged for the 6 [place] vascular sheath through which a 105/12 cm multi side-hole EKOS ultrasound assisted infusion catheter was positioned extending into lower lobe branch of the right pulmonary artery. A postprocedural fluoroscopic image was obtained of the check demonstrating final catheter positioning. Both vascular sheaths were secured at the right neck with 0 silk sutures. The external catheter tubing was secured at the right chest and the lytic therapy was initiated. The patient tolerated the procedure well without immediate postprocedural complication. FINDINGS: Elevated pulmonary arterial pressure, Right main pulmonary artery - 40/23  mean (normal: < 25/10). Following the procedure, both ultrasound assisted infusion catheter tips terminate within the distal aspects of the bilateral lower lobe sub segmental pulmonary arteries. Ultrasound assisted catheter directed bilateral pulmonary arterial thrombolysis was initiated. IMPRESSION: 1. Successful fluoroscopic guided initiation of bilateral ultrasound assisted catheter directed pulmonary arterial lysis for sub massive pulmonary embolism and right-sided heart strain. 2. Markedly elevated pressure measurements within the right main pulmonary artery compatible with critical pulmonary arterial hypertension. PLAN: Overnight ICU observation with follow-up pressure measurements in the morning. Electronically Signed   By: Corlis Leak M.D.   On: 06/16/2016 10:18   Ir Angiogram Selective Each Additional Vessel  Result Date: 06/16/2016 INDICATION: Sub massive pulmonary emboli with right heart strain, elevated troponins, shortness of breath. EXAM: 1. ULTRASOUND GUIDANCE FOR VENOUS ACCESS X2 2. FLUOROSCOPIC GUIDED PLACEMENT OF BILATERAL PULMONARY ARTERIAL LYTIC INFUSION CATHETERS COMPARISON:  Chest CTA - 02/05/2014 MEDICATIONS: Versed 3 mg IV; Fentanyl 100 mcg IV Sedation time: 60 minutes FLUOROSCOPY  TIME:  10 minutes 54 seconds. COMPLICATIONS: None immediate TECHNIQUE: Informed written consent was obtained from the patient after a discussion of the risks, benefits and alternatives to treatment. Questions regarding the procedure were encouraged and answered. A timeout was performed prior to the initiation of the procedure. Because of known lower extremity DVT, jugular approach was selected for vascular access. Patency of the right IJ vein confirmed with ultrasound. The right neck was prepped and draped in the usual sterile fashion, and a sterile drape was applied covering the operative field. Maximum barrier sterile technique with sterile gowns and gloves were used for the procedure. A timeout was performed prior to the initiation of the procedure. Local anesthesia was provided with 1% lidocaine. Under direct ultrasound guidance, the right internal jugular vein was accessed with a micro puncture transitional dilator, dilated with a 6 French vascular sheath. With the use of a glidewire, an angled pigtail catheter was advanced into the right main pulmonary artery . Pressure measurements were then obtained from the right pulmonary artery. Catheter exchanged for the vascular sheath through which a 105/18 cm multi side-hole EKOS  ultrasound assisted infusion catheter was positioned extending into lower lobe branch of the right pulmonary artery. Slightly caudal to this initial access, the right internal jugular vein was again accessed with a micropuncture set, ultimately allowing placement of a 7 French angled pigtail catheter, advanced into the left pulmonary artery, exchanged for the 6 [place] vascular sheath through which a 105/12 cm multi side-hole EKOS ultrasound assisted infusion catheter was positioned extending into lower lobe branch of the right pulmonary artery. A postprocedural fluoroscopic image was obtained of the check demonstrating final catheter positioning. Both vascular sheaths were secured at the  right neck with 0 silk sutures. The external catheter tubing was secured at the right chest and the lytic therapy was initiated. The patient tolerated the procedure well without immediate postprocedural complication. FINDINGS: Elevated pulmonary arterial pressure, Right main pulmonary artery - 40/23  mean 30mmHg (normal: < 25/10). Following the procedure, both ultrasound assisted infusion catheter tips terminate within the distal aspects of the bilateral lower lobe sub segmental pulmonary arteries. Ultrasound assisted catheter directed bilateral pulmonary arterial thrombolysis was initiated. IMPRESSION: 1. Successful fluoroscopic guided initiation of bilateral ultrasound assisted catheter directed pulmonary arterial lysis for sub massive pulmonary embolism and right-sided heart strain. 2. Markedly elevated pressure measurements within the right main pulmonary artery compatible with critical pulmonary arterial hypertension. PLAN: Overnight ICU observation with follow-up pressure measurements in the morning. Electronically Signed   By: Corlis Leak  Hassell M.D.   On: 06/16/2016 10:18   Ir [place] Guide Vasc Access Right  Result Date: 06/16/2016 INDICATION: Sub massive pulmonary emboli with right heart strain, elevated troponins, shortness of breath. EXAM: 1. ULTRASOUND GUIDANCE FOR VENOUS ACCESS X2 2. FLUOROSCOPIC GUIDED PLACEMENT OF BILATERAL PULMONARY ARTERIAL LYTIC INFUSION CATHETERS COMPARISON:  Chest CTA - 02/05/2014 MEDICATIONS: Versed 3 mg IV; Fentanyl 100 mcg IV Sedation time: 60 minutes FLUOROSCOPY TIME:  10 minutes 54 seconds. COMPLICATIONS: None immediate TECHNIQUE: Informed written consent was obtained from the patient after a discussion of the risks, benefits and alternatives to treatment. Questions regarding the procedure were encouraged and answered. A timeout was performed prior to the initiation of the procedure. Because of known lower extremity DVT, jugular approach was selected for vascular access. Patency of  the right IJ vein confirmed with ultrasound. The right neck was prepped and draped in the usual sterile fashion, and a sterile drape was applied covering the operative field. Maximum barrier sterile technique with sterile gowns and gloves were used for the procedure. A timeout was performed prior to the initiation of the procedure. Local anesthesia was provided with 1% lidocaine. Under direct ultrasound guidance, the right internal jugular vein was accessed with a micro puncture transitional dilator, dilated with a 6 French vascular sheath. With the use of a glidewire, an angled pigtail catheter was advanced into the right main pulmonary artery . Pressure measurements were then obtained from the right pulmonary artery. Catheter exchanged for the vascular sheath through which a 105/18 cm multi side-hole EKOS ultrasound assisted infusion catheter was positioned extending into lower lobe branch of the right pulmonary artery. Slightly caudal to this initial access, the right internal jugular vein was again accessed with a micropuncture set, ultimately allowing placement of a 7 French angled pigtail catheter, advanced into the left pulmonary artery, exchanged for the 6 [place] vascular sheath through which a 105/12 cm multi side-hole EKOS ultrasound assisted infusion catheter was positioned extending into lower lobe branch of the right pulmonary artery. A postprocedural fluoroscopic image was obtained of the check demonstrating final catheter  positioning. Both vascular sheaths were secured at the right neck with 0 silk sutures. The external catheter tubing was secured at the right chest and the lytic therapy was initiated. The patient tolerated the procedure well without immediate postprocedural complication. FINDINGS: Elevated pulmonary arterial pressure, Right main pulmonary artery - 40/23  mean (normal: < 25/10). Following the procedure, both ultrasound assisted infusion catheter tips terminate within the distal  aspects of the bilateral lower lobe sub segmental pulmonary arteries. Ultrasound assisted catheter directed bilateral pulmonary arterial thrombolysis was initiated. IMPRESSION: 1. Successful fluoroscopic guided initiation of bilateral ultrasound assisted catheter directed pulmonary arterial lysis for sub massive pulmonary embolism and right-sided heart strain. 2. Markedly elevated pressure measurements within the right main pulmonary artery compatible with critical pulmonary arterial hypertension. PLAN: Overnight ICU observation with follow-up pressure measurements in the morning. Electronically Signed   By: Corlis Leak M.D.   On: 06/16/2016 10:18   Dg Chest Port 1 View  Result Date: 06/16/2016 CLINICAL DATA:  Known pulmonary embolism with shortness of Breath EXAM: PORTABLE CHEST 1 VIEW COMPARISON:  06/14/2016 FINDINGS: Cardiac shadow is mildly prominent but stable from the prior exam. Two right jugular infusion catheters are now seen extending into the pulmonary arteries bilaterally. The lungs are well aerated bilaterally. No bony abnormality is seen. IMPRESSION: No acute abnormality noted. EKOS infusion catheters are noted bilaterally. Electronically Signed   By: Alcide Clever M.D.   On: 06/16/2016 07:57   Ir Infusion Thrombol Arterial Initial (ms)  Result Date: 06/16/2016 INDICATION: Sub massive pulmonary emboli with right heart strain, elevated troponins, shortness of breath. EXAM: 1. ULTRASOUND GUIDANCE FOR VENOUS ACCESS X2 2. FLUOROSCOPIC GUIDED PLACEMENT OF BILATERAL PULMONARY ARTERIAL LYTIC INFUSION CATHETERS COMPARISON:  Chest CTA - 02/05/2014 MEDICATIONS: Versed 3 mg IV; Fentanyl 100 mcg IV Sedation time: 60 minutes FLUOROSCOPY TIME:  10 minutes 54 seconds. COMPLICATIONS: None immediate TECHNIQUE: Informed written consent was obtained from the patient after a discussion of the risks, benefits and alternatives to treatment. Questions regarding the procedure were encouraged and answered. A timeout was  performed prior to the initiation of the procedure. Because of known lower extremity DVT, jugular approach was selected for vascular access. Patency of the right IJ vein confirmed with ultrasound. The right neck was prepped and draped in the usual sterile fashion, and a sterile drape was applied covering the operative field. Maximum barrier sterile technique with sterile gowns and gloves were used for the procedure. A timeout was performed prior to the initiation of the procedure. Local anesthesia was provided with 1% lidocaine. Under direct ultrasound guidance, the right internal jugular vein was accessed with a micro puncture transitional dilator, dilated with a 6 French vascular sheath. With the use of a glidewire, an angled pigtail catheter was advanced into the right main pulmonary artery . Pressure measurements were then obtained from the right pulmonary artery. Catheter exchanged for the vascular sheath through which a 105/18 cm multi side-hole EKOS ultrasound assisted infusion catheter was positioned extending into lower lobe branch of the right pulmonary artery. Slightly caudal to this initial access, the right internal jugular vein was again accessed with a micropuncture set, ultimately allowing placement of a 7 French angled pigtail catheter, advanced into the left pulmonary artery, exchanged for the 6 [place] vascular sheath through which a 105/12 cm multi side-hole EKOS ultrasound assisted infusion catheter was positioned extending into lower lobe branch of the right pulmonary artery. A postprocedural fluoroscopic image was obtained of the check demonstrating final catheter positioning. Both  vascular sheaths were secured at the right neck with 0 silk sutures. The external catheter tubing was secured at the right chest and the lytic therapy was initiated. The patient tolerated the procedure well without immediate postprocedural complication. FINDINGS: Elevated pulmonary arterial pressure, Right main  pulmonary artery - 40/23  mean (normal: < 25/10). Following the procedure, both ultrasound assisted infusion catheter tips terminate within the distal aspects of the bilateral lower lobe sub segmental pulmonary arteries. Ultrasound assisted catheter directed bilateral pulmonary arterial thrombolysis was initiated. IMPRESSION: 1. Successful fluoroscopic guided initiation of bilateral ultrasound assisted catheter directed pulmonary arterial lysis for sub massive pulmonary embolism and right-sided heart strain. 2. Markedly elevated pressure measurements within the right main pulmonary artery compatible with critical pulmonary arterial hypertension. PLAN: Overnight ICU observation with follow-up pressure measurements in the morning. Electronically Signed   By: Corlis Leak M.D.   On: 06/16/2016 10:18   Ir Infusion Thrombol Arterial Initial (ms)  Result Date: 06/16/2016 INDICATION: Sub massive pulmonary emboli with right heart strain, elevated troponins, shortness of breath. EXAM: 1. ULTRASOUND GUIDANCE FOR VENOUS ACCESS X2 2. FLUOROSCOPIC GUIDED PLACEMENT OF BILATERAL PULMONARY ARTERIAL LYTIC INFUSION CATHETERS COMPARISON:  Chest CTA - 02/05/2014 MEDICATIONS: Versed 3 mg IV; Fentanyl 100 mcg IV Sedation time: 60 minutes FLUOROSCOPY TIME:  10 minutes 54 seconds. COMPLICATIONS: None immediate TECHNIQUE: Informed written consent was obtained from the patient after a discussion of the risks, benefits and alternatives to treatment. Questions regarding the procedure were encouraged and answered. A timeout was performed prior to the initiation of the procedure. Because of known lower extremity DVT, jugular approach was selected for vascular access. Patency of the right IJ vein confirmed with ultrasound. The right neck was prepped and draped in the usual sterile fashion, and a sterile drape was applied covering the operative field. Maximum barrier sterile technique with sterile gowns and gloves were used for the  procedure. A timeout was performed prior to the initiation of the procedure. Local anesthesia was provided with 1% lidocaine. Under direct ultrasound guidance, the right internal jugular vein was accessed with a micro puncture transitional dilator, dilated with a 6 French vascular sheath. With the use of a glidewire, an angled pigtail catheter was advanced into the right main pulmonary artery . Pressure measurements were then obtained from the right pulmonary artery. Catheter exchanged for the vascular sheath through which a 105/18 cm multi side-hole EKOS ultrasound assisted infusion catheter was positioned extending into lower lobe branch of the right pulmonary artery. Slightly caudal to this initial access, the right internal jugular vein was again accessed with a micropuncture set, ultimately allowing placement of a 7 French angled pigtail catheter, advanced into the left pulmonary artery, exchanged for the 6 [place] vascular sheath through which a 105/12 cm multi side-hole EKOS ultrasound assisted infusion catheter was positioned extending into lower lobe branch of the right pulmonary artery. A postprocedural fluoroscopic image was obtained of the check demonstrating final catheter positioning. Both vascular sheaths were secured at the right neck with 0 silk sutures. The external catheter tubing was secured at the right chest and the lytic therapy was initiated. The patient tolerated the procedure well without immediate postprocedural complication. FINDINGS: Elevated pulmonary arterial pressure, Right main pulmonary artery - 40/23  mean (normal: < 25/10). Following the procedure, both ultrasound assisted infusion catheter tips terminate within the distal aspects of the bilateral lower lobe sub segmental pulmonary arteries. Ultrasound assisted catheter directed bilateral pulmonary arterial thrombolysis was initiated. IMPRESSION: 1. Successful fluoroscopic guided  initiation of bilateral ultrasound assisted  catheter directed pulmonary arterial lysis for sub massive pulmonary embolism and right-sided heart strain. 2. Markedly elevated pressure measurements within the right main pulmonary artery compatible with critical pulmonary arterial hypertension. PLAN: Overnight ICU observation with follow-up pressure measurements in the morning. Electronically Signed   By: Corlis Leak  Hassell M.D.   On: 06/16/2016 10:18   Ir Rande Lawmanhromb F/u Eval Art/ven Final Day (ms)  Result Date: 06/16/2016 CLINICAL DATA:  Sub massive bilateral pulmonary emboli, status post bilateral catheter directed ultrasound assisted overnight thrombolysis. EXAM: IR THROMB F/U EVAL ART/VEN FINAL DAY ANESTHESIA/SEDATION: None MEDICATIONS: None CONTRAST:  None PROCEDURE: Pressure measurements were obtained from the right pulmonary arterial catheter bedside in the ICU. Both pulmonary arterial infusion catheters and venous sheaths were removed and hemostasis achieved at the sites with manual compression. COMPLICATIONS: None immediate FINDINGS: The right pulmonary arterial pressure is 28/17(mean 20) mmHg after overnight thrombolytic infusion. Preop pressures 40/23(30). IMPRESSION: 1. Significant interval improvement in pulmonary arterial hypertension post overnight ultrasound assisted catheter directed pulmonary arterial thrombolytic infusion. Electronically Signed   By: Corlis Leak  Hassell M.D.   On: 06/16/2016 10:39    CTA chest 12/3:   CT ANGIOGRAPHY CHEST, ABDOMEN AND PELVIS  TECHNIQUE: Multidetector CT imaging through the chest, abdomen and pelvis was performed using the standard protocol during bolus administration of intravenous contrast. Multiplanar reconstructed images and MIPs were obtained and reviewed to evaluate the vascular anatomy.  CONTRAST: 100 mL of Isovue 370 IV contrast  COMPARISON: CT of the abdomen and pelvis performed 04/20/2016, and CTA of the chest performed 01/10/2015  FINDINGS: CTA CHEST FINDINGS  Cardiovascular: Pulmonary embolus is  noted within both main pulmonary arteries, extending into all lobes of both lungs. The RV/LV ratio is 2.0, corresponding to at least submassive pulmonary embolus and right heart strain.  The heart is otherwise unremarkable in appearance. The thoracic aorta is within normal limits. No calcific atherosclerotic disease is seen. The great vessels are unremarkable. There is no evidence of aortic dissection. There is no evidence of aneurysmal dilatation.  Mediastinum/Nodes: No mediastinal lymphadenopathy is seen. No pericardial effusion is identified. The visualized portions of thyroid gland are unremarkable. No axillary lymphadenopathy is seen.  Lungs/Pleura: The lungs are clear bilaterally. No focal consolidation, pleural effusion or pneumothorax is seen. No masses are identified.  Musculoskeletal: No acute osseous abnormalities are identified. The visualized musculature is unremarkable in appearance.  Review of the MIP images confirms the above findings.  CTA ABDOMEN AND PELVIS FINDINGS  VASCULAR  Aorta: There is no evidence of aortic dissection. There is no evidence of aneurysmal dilatation. No calcific atherosclerotic disease is seen.  Celiac: The celiac trunk is unremarkable in appearance.  SMA: The superior mesenteric artery is within normal limits.  Renals: The renal arteries are intact bilaterally. Note is made of 2 left-sided renal arteries.  IMA: The inferior mesenteric artery remains patent.  Inflow: The common, internal and external iliac arteries are patent bilaterally. The common femoral arteries and their proximal branches are grossly unremarkable.  Veins: Visualized venous structures are grossly unremarkable in appearance. The inferior vena cava remains patent.  Review of the MIP images confirms the above findings.  NON-VASCULAR  Hepatobiliary: The liver is unremarkable in appearance. The patient is status post cholecystectomy, with clips noted at the  gallbladder fossa. The common bile duct remains normal in caliber.  Pancreas: The pancreas is within normal limits.  Spleen: The spleen is unremarkable in appearance.  Adrenals/Urinary Tract: The adrenal glands are unremarkable  in appearance. The kidneys are within normal limits. There is no evidence of hydronephrosis. No renal or ureteral stones are identified. No perinephric stranding is seen.  Stomach/Bowel: The stomach is unremarkable in appearance. The small bowel is within normal limits. The appendix is normal in caliber, without evidence of appendicitis. The colon is unremarkable in appearance.  Lymphatic: No retroperitoneal lymphadenopathy is seen. No pelvic sidewall lymphadenopathy is identified.  Reproductive: The bladder is mildly distended and grossly unremarkable. A likely fibroid is noted at the uterine fundus. The ovaries are grossly symmetric. No suspicious adnexal masses are seen.  Other: No additional soft tissue abnormalities are seen.  Musculoskeletal: No acute osseous abnormalities are identified. The visualized musculature is unremarkable in appearance.  Review of the MIP images confirms the above findings.  IMPRESSION: 1. Pulmonary embolus within the main pulmonary arteries, extending into all lobes of both lungs. Positive for acute PE with CT evidence of right heart strain (RV/LV Ratio = 2.0) consistent with at least submassive (intermediate risk) PE. The presence of right heart strain has been associated with an increased risk of morbidity and mortality. 2. No evidence of aortic dissection. No evidence of aneurysmal dilatation. No calcific atherosclerotic disease seen. 3. Lungs clear bilaterally. 4. Uterine fibroid noted.  Critical Value/emergent results were called by telephone at the time of interpretation on 06/14/2016 at 10:36 pm to [facility] PA, who verbally acknowledged these results.   Electronically Signed By: Roanna Raider  M.D. On: 06/14/2016 22:44   Doppler duplex 12/4: **Preliminary report by tech**  Bilateral lower extremity venous duplex complete. There is evidence of deep vein thrombosis involving the posterior tibial, and peroneal veins of the right lower extremity.  There is no evidence of deep vein thrombosis involving the left lower extremity. There is no evidence of superficial vein thrombosis involving the right or left lower extremities. There is no evidence of Baker's cysts bilaterally.   Echo 12/4: Study Conclusions - Left ventricle: The cavity size was normal. Wall thickness was   normal. Systolic function was normal. The estimated ejection   fraction was in the range of 60% to 65%. Wall motion was normal;   there were no regional wall motion abnormalities. Doppler   parameters are consistent with abnormal left ventricular   relaxation (grade 1 diastolic dysfunction). - Right ventricle: The cavity size was moderately dilated. Systolic   function was moderately reduced.   Discharge Exam: Vitals:   06/18/16 0751 06/18/16 1105  BP: 125/87 (!) 123/91  Pulse: 82 86  Resp: 18 (!) 22  Temp:  98.6 F (37 C)   Vitals:   06/18/16 0029 06/18/16 0434 06/18/16 0751 06/18/16 1105  BP: 119/85 130/82 125/87 (!) 123/91  Pulse: 82 82 82 86  Resp: 18 18 18  (!) 22  Temp: 97.8 F (36.6 C) 98 F (36.7 C)  98.6 F (37 C)  TempSrc: Oral Oral  Oral  SpO2: 99% 98% 97% 99%  Weight:  122.5 kg (270 lb)    Height:        General: Pt is alert, awake, not in acute distress Cardiovascular: RRR, S1/S2 +, no rubs, no gallops Respiratory: CTA bilaterally, no wheezing, no rhonchi, not on O2  Abdominal: Soft, NT, ND, bowel sounds + Extremities: +nonpitting edema right calf, no cyanosis    The results of significant diagnostics from this hospitalization (including imaging, microbiology, ancillary and laboratory) are listed below for reference.     Microbiology: Recent Results (from the past 240  hour(s))  MRSA PCR  Screening     Status: None   Collection Time: 06/15/16  2:12 AM  Result Value Ref Range Status   MRSA by PCR NEGATIVE NEGATIVE Final    Comment:        The GeneXpert MRSA Assay (FDA approved for NASAL specimens only), is one component of a comprehensive MRSA colonization surveillance program. It is not intended to diagnose MRSA infection nor to guide or monitor treatment for MRSA infections.      Labs: BNP (last 3 results)  Recent Labs  06/15/16 0433  BNP 60.0   Basic Metabolic Panel:  Recent Labs Lab 06/15/16 0433 06/16/16 0400 06/17/16 0439 06/18/16 0237  NA 135 137 138 138  K 4.5 3.7 3.4* 3.6  CL 108 112* 113* 111  CO2 20* 17* 20* 20*  GLUCOSE 141* 97 121* 109*  BUN 7 7 5* 6  CREATININE 1.18* 1.13* 1.04* 1.02*  CALCIUM 8.3* 8.0* 7.9* 8.4*  MG 2.2 2.1 2.0 2.1  PHOS 3.8 3.7 3.3 3.4   Liver Function Tests:  Recent Labs Lab 06/16/16 0400 06/17/16 0439 06/18/16 0237  ALBUMIN 2.8* 2.5* 2.9*   No results for input(s): LIPASE, AMYLASE in the last 168 hours. No results for input(s): AMMONIA in the last 168 hours. CBC:  Recent Labs Lab 06/16/16 0400 06/16/16 1119 06/16/16 1302 06/17/16 0439 06/18/16 0237  WBC 8.9 9.7 8.9 7.6 9.3  NEUTROABS  --   --   --  3.9  --   HGB 11.4* 11.3* 11.0* 11.0* 11.9*  HCT 34.7* 35.0* 33.6* 33.4* 36.4  MCV 87.8 87.5 87.3 86.3 86.1  PLT 162 156 162 168 198   Cardiac Enzymes:  Recent Labs Lab 06/15/16 0433 06/15/16 0954 06/15/16 1454  TROPONINI 1.28* 0.98* 0.59*   BNP: Invalid input(s): POCBNP CBG:  Recent Labs Lab 06/15/16 0215 06/15/16 1614  GLUCAP 142* 97   D-Dimer No results for input(s): DDIMER in the last 72 hours. Hgb A1c No results for input(s): HGBA1C in the last 72 hours. Lipid Profile No results for input(s): CHOL, HDL, LDLCALC, TRIG, CHOLHDL, LDLDIRECT in the last 72 hours. Thyroid function studies No results for input(s): TSH, T4TOTAL, T3FREE, THYROIDAB in the last 72  hours.  Invalid input(s): FREET3 Anemia work up No results for input(s): VITAMINB12, FOLATE, FERRITIN, TIBC, IRON, RETICCTPCT in the last 72 hours. Urinalysis    Component Value Date/Time   COLORURINE YELLOW 06/15/2016 0540   APPEARANCEUR CLOUDY (A) 06/15/2016 0540   LABSPEC 1.020 06/15/2016 0540   PHURINE 5.0 06/15/2016 0540   GLUCOSEU NEGATIVE 06/15/2016 0540   HGBUR LARGE (A) 06/15/2016 0540   BILIRUBINUR NEGATIVE 06/15/2016 0540   KETONESUR NEGATIVE 06/15/2016 0540   PROTEINUR NEGATIVE 06/15/2016 0540   NITRITE NEGATIVE 06/15/2016 0540   LEUKOCYTESUR NEGATIVE 06/15/2016 0540   Sepsis Labs Invalid input(s): PROCALCITONIN,  WBC,  LACTICIDVEN Microbiology Recent Results (from the past 240 hour(s))  MRSA PCR Screening     Status: None   Collection Time: 06/15/16  2:12 AM  Result Value Ref Range Status   MRSA by PCR NEGATIVE NEGATIVE Final    Comment:        The GeneXpert MRSA Assay (FDA approved for NASAL specimens only), is one component of a comprehensive MRSA colonization surveillance program. It is not intended to diagnose MRSA infection nor to guide or monitor treatment for MRSA infections.      Time coordinating discharge: Over 30 minutes  SIGNED:  Noralee Stain, DO [facility] Pager [phone]  If 7PM-7AM, please contact  night-coverage [email or web address] Password [facility] 06/18/2016, 12:37 PM

## 2016-06-18 NOTE — Progress Notes (Addendum)
Noted orders for HHPT/ DME; CM talked to patient with spouse present about HHC; pt was independent prior to admission ambulating 350 ft; patient declined HHC/ DME at this time; CM informed pt that is she changed her mind after discharge her PCP can arrange the services from the office; CM also noted that patient would only answer questions with one or two words during the discussion about HHC and closed her eyes and went to sleep. Abelino DerrickB Marlaysia Lenig [facility] IncRN,MHA,BSN [phone]

## 2016-06-18 NOTE — Progress Notes (Signed)
ANTICOAGULATION CONSULT NOTE - Follow Up Consult  Pharmacy Consult for heparin Indication: pulmonary embolus  Labs:  Recent Labs  06/15/16 0433 06/15/16 0954 06/15/16 1454  06/16/16 0400  06/16/16 1302 06/17/16 0439 06/17/16 1034 06/18/16 0237  HGB 12.5  --   --   < > 11.4*  < > 11.0* 11.0*  --  11.9*  HCT 38.1  --   --   < > 34.7*  < > 33.6* 33.4*  --  36.4  PLT 206  --   --   < > 162  < > 162 168  --  198  HEPARINUNFRC 0.79* 0.68  --   < > 0.40  < > 0.31  --  0.43 0.29*  CREATININE 1.18*  --   --   --  1.13*  --   --  1.04*  --   --   TROPONINI 1.28* 0.98* 0.59*  --   --   --   --   --   --   --   < > = values in this interval not displayed.   Assessment: 45yo female now slightly below goal on heparin after several levels at lower end of goal.  Goal of Therapy:  Heparin level 0.3-0.7 units/ml   Plan:  Will increase heparin gtt slightly to 1350 units/hr to 1350 units/hr and check level in 6hr.  Carrie Joyce, PharmD, BCPS  06/18/2016,3:23 AM

## 2016-06-18 NOTE — Discharge Instructions (Signed)
Rivaroxaban (Xarelto) ED Discharge Instructions  ° °Patient received a prescription for  °Xarelto 15 & 20 mg - 51 tablet VTE STARTER PACK. °  °Patient understands only the FIRST 30 DAYS OF TREATMENT  °will be provided by the starter pack. °  °Patient understands to contact primary care doctor or ED immediately if for any reason is unable to fill the starter pack prescription. ° °Patient must schedule a follow-up appointment with primary care doctor within 15 days of discharge in order to receive the maintenance prescription and clinical follow up. ° °Patient has received an education kit containing ([facility] Trial Offer Card, DVT/PE brochure, Dosing Diary, and Xarelto Medication Guide).  ° °If not performed in the ED, patient will receive medication counseling by a [facility] pharmacist via phone follow-up within the next 72 hours. Pharmacist to review signs and symptoms of bleeding and proper use of this medication.  ° °Call 911 or return immediately to the nearest ED if you develop bleeding (e.g. nose, gums, vomit, urine, bloody or dark stools), unusual bruising, head trauma (even if minor), severe headache, altered mental status, change in speech, weakness on one side of body, shortness of breath, swollen lips/tongue/face/neck, chest pain, or other concerns. °   °Information on my medicine - XARELTO® (rivaroxaban)  °This medication education was provided to me or my healthcare representative as part of my discharge instructions. °  °WHY WAS XARELTO® PRESCRIBED FOR YOU?  °Xarelto® was prescribed to treat blood clots that may have been found in the veins of your legs (deep vein thrombosis) or in your lungs (pulmonary embolism) and to reduce the risk of them occurring again. °  °WHAT DO YOU NEED TO KNOW ABOUT XARELTO®?  °The starting dose is one 15 mg tablet taken TWICE daily with food for the FIRST 21 DAYS then on day 22 the dose is changed to one 20 mg tablet taken ONCE A DAY with your evening meal. °  °DO NOT  stop taking Xarelto® without talking to the health care provider who prescribed the medication. Refill your prescription for 20 mg tablets before you run out.  °After discharge, you should have regular check-up appointments with your healthcare provider that is prescribing your Xarelto®. In the future your dose may need to be changed if your kidney function changes by a significant amount.  ° °WHAT DO YOU DO IF YOU MISS A DOSE?  °If you are taking Xarelto® TWICE DAILY and you miss a dose, take it as soon as you remember. You may take two 15 mg tablets (total 30 mg) at the same time then resume your regularly scheduled 15 mg twice daily the next day. °  °If you are taking Xarelto® ONCE DAILY and you miss a dose, take it as soon as you remember on the same day then continue your regularly scheduled once daily regimen the next day. Do not take two doses of Xarelto® at the same time. °  °IMPORTANT SAFETY INFORMATION  °Xarelto® is a blood thinner medicine that can cause bleeding. You should call your healthcare provider right away if you experience any of the following:  °-  Bleeding from an injury or your nose that does not stop.  °-  Unusual colored urine (red or dark brown) or unusual colored stools (red or black).  °-  Unusual bruising for unknown reasons.  °-  A serious fall or if you hit your head (even if there is no bleeding).  ° °Some medicines may interact with Xarelto® and   might increase your risk of bleeding while on Xarelto®. To help avoid this, consult your healthcare provider or pharmacist prior to using any new prescription or non-prescription medications, including herbals, vitamins, non-steroidal anti-inflammatory drugs (NSAIDs) and supplements.  ° °This website has more information on Xarelto®: www.xarelto.com. ° °

## 2016-06-18 NOTE — Evaluation (Signed)
Physical Therapy Evaluation Patient Details Name: Carrie Joyce Fake MRN: [id number] DOB: 05/09/1971 Today's Date: 06/18/2016   History of Present Illness  Pt admit with Submassive PE with right heart strain  Clinical Impression  Pt admitted with above diagnosis. Pt currently with functional limitations due to the deficits listed below (see PT Problem List). Pt was able to ambulate and go up and down stairs with slight unsteadiness at times.  Pt is encouraged  To use 4 wheeled RW at all times initially for safety in the home.O2 sats 94% on RA with DOE 2-3/4 with ambulation. HR 94-106 bpm.   HHPT and HHOT and 3N1 recommended as well. Will follow acutely. Pt will benefit from skilled PT to increase their independence and safety with mobility to allow discharge to the venue listed below.      Follow Up Recommendations Home health PT;Supervision - Intermittent (HHOT)    Equipment Recommendations  3in1 (PT)    Recommendations for Other Services       Precautions / Restrictions Precautions Precautions: Fall Restrictions Weight Bearing Restrictions: No      Mobility  Bed Mobility Overal bed mobility: Independent                Transfers Overall transfer level: Independent                  Ambulation/Gait Ambulation/Gait assistance: Supervision;Min guard Ambulation Distance (Feet): 350 Feet Assistive device: None Gait Pattern/deviations: Step-through pattern;Decreased stride length;Drifts right/left;Shuffle   Gait velocity interpretation: Below normal speed for age/gender General Gait Details: Pt was able to ambulate in hallway without LOB but at times unsteady gait noted.   When pt questioned, she states her legs are tired but that she is not unsteady.  No significant LOB however pt definitely not 100%.  Pt encouraged to use her RW for safety at d/c  until she improves and pt in agreement.    Stairs Stairs: Yes   Stair Management: One rail Left;Step to  pattern;Forwards Number of Stairs: 4 General stair comments: IV limited ability to perform 10 steps however pt had no difficulty with 4 other than it incr her pain.   Wheelchair Mobility    Modified Rankin (Stroke Patients Only)       Balance Overall balance assessment: Needs assistance Sitting-balance support: No upper extremity supported;Feet supported Sitting balance-Leahy Scale: Good     Standing balance support: No upper extremity supported;During functional activity Standing balance-Leahy Scale: Fair Standing balance comment: can stand statically without LOB but cannot withstand challenges to balance.                              Pertinent Vitals/Pain Pain Assessment: 0-10 Pain Score: 8  Pain Location: bil LEs Pain Descriptors / Indicators: Aching;Constant;Cramping;Burning;Squeezing Pain Intervention(s): Limited activity within patient's tolerance;Monitored during session;Repositioned;Other (comment) (offered pain meds but pt declined)  O2 sats 94% on RA with DOE 2-3/4 with ambulation. HR 94-106 bpm.      Home Living Family/patient expects to be discharged to:: Private residence Living Arrangements: Spouse/significant other;Children Available Help at Discharge: Family;Available PRN/intermittently Type of Home: House Home Access: Stairs to enter Entrance Stairs-Rails: Doctor, general practiceight;Left Entrance Stairs-Number of Steps: 10 Home Layout: One level Home Equipment: Walker - 4 wheels      Prior Function Level of Independence: Independent               Hand Dominance        Extremity/Trunk  Assessment   Upper Extremity Assessment: Defer to OT evaluation           Lower Extremity Assessment: Generalized weakness      Cervical / Trunk Assessment: Normal  Communication   Communication: No difficulties  Cognition Arousal/Alertness: Lethargic;Suspect due to medications Behavior During Therapy: Flat affect Overall Cognitive Status: Within Functional  Limits for tasks assessed Area of Impairment: Following commands;Problem solving       Following Commands: Follows one step commands with increased time     Problem Solving: Difficulty sequencing;Requires verbal cues      General Comments      Exercises     Assessment/Plan    PT Assessment Patient needs continued PT services  PT Problem List Decreased activity tolerance;Decreased balance;Decreased mobility;Decreased knowledge of use of DME;Decreased safety awareness;Decreased knowledge of precautions;Pain          PT Treatment Interventions Gait training;DME instruction;Functional mobility training;Stair training;Therapeutic activities;Therapeutic exercise;Balance training;Patient/family education    PT Goals (Current goals can be found in the Care Plan section)  Acute Rehab PT Goals Patient Stated Goal: to go home PT Goal Formulation: With patient Time For Goal Achievement: 06/25/16 Potential to Achieve Goals: Good    Frequency Min 3X/week   Barriers to discharge Decreased caregiver support      Co-evaluation               End of Session Equipment Utilized During Treatment: Gait belt Activity Tolerance: Patient limited by fatigue Patient left: in bed;with family/visitor present Nurse Communication: Mobility status         Time: [id number] PT Time Calculation (min) (ACUTE ONLY): 16 min   Charges:   PT Evaluation $PT Eval Moderate Complexity: 1 Procedure     PT G Codes:        Carrie Joyce Carrie Joyce 06/18/2016, 2:19 PM [facility],PT Acute Rehabilitation [phone] (pager)

## 2016-06-18 NOTE — Progress Notes (Signed)
Patient with no complaints or concerns during 7pm - 7am shift.  Jaydynn Wolford, RN 

## 2016-06-18 NOTE — Progress Notes (Signed)
PT note Full note to follow.  Pt will need 3N1 and HHPT and HHOT.  Feels ready for d/c. Sats 94% on RA with ambulation and up and down steps. Thanks.  [facility] Sawaya,PT Acute Rehabilitation [phone] (pager)

## 2016-06-18 NOTE — Progress Notes (Signed)
Xarelto coupon card given to patient with explanation of usage; B Khris Jansson RN,MHA,BSN [phone] 

## 2016-06-18 NOTE — Progress Notes (Signed)
RE: Benefit check    Iris D Bratton CMA        The co-pay for Xarelto at 15mg  twice a day will be $35.00 this is a covered drug and no prior autherization is required.

## 2016-07-15 DIAGNOSIS — I2699 Other pulmonary embolism without acute cor pulmonale: Secondary | ICD-10-CM | POA: Diagnosis not present

## 2016-07-16 DIAGNOSIS — F419 Anxiety disorder, unspecified: Secondary | ICD-10-CM | POA: Diagnosis not present

## 2016-07-16 DIAGNOSIS — I2699 Other pulmonary embolism without acute cor pulmonale: Secondary | ICD-10-CM | POA: Diagnosis not present

## 2016-07-17 DIAGNOSIS — Z7901 Long term (current) use of anticoagulants: Secondary | ICD-10-CM | POA: Diagnosis not present

## 2016-07-17 DIAGNOSIS — F419 Anxiety disorder, unspecified: Secondary | ICD-10-CM | POA: Diagnosis not present

## 2016-07-17 DIAGNOSIS — I2699 Other pulmonary embolism without acute cor pulmonale: Secondary | ICD-10-CM | POA: Diagnosis not present

## 2016-07-17 DIAGNOSIS — I2782 Chronic pulmonary embolism: Secondary | ICD-10-CM | POA: Diagnosis not present

## 2016-07-17 DIAGNOSIS — R319 Hematuria, unspecified: Secondary | ICD-10-CM | POA: Diagnosis not present

## 2016-07-17 DIAGNOSIS — N939 Abnormal uterine and vaginal bleeding, unspecified: Secondary | ICD-10-CM | POA: Diagnosis not present

## 2016-07-20 ENCOUNTER — Ambulatory Visit: Payer: BLUE CROSS/BLUE SHIELD | Admitting: Family Medicine

## 2016-07-30 DIAGNOSIS — Z7901 Long term (current) use of anticoagulants: Secondary | ICD-10-CM | POA: Diagnosis not present

## 2016-07-30 DIAGNOSIS — I2699 Other pulmonary embolism without acute cor pulmonale: Secondary | ICD-10-CM | POA: Diagnosis not present

## 2016-07-30 DIAGNOSIS — F419 Anxiety disorder, unspecified: Secondary | ICD-10-CM | POA: Diagnosis not present

## 2016-07-30 DIAGNOSIS — D6852 Prothrombin gene mutation: Secondary | ICD-10-CM | POA: Diagnosis not present

## 2016-08-07 ENCOUNTER — Ambulatory Visit: Payer: BLUE CROSS/BLUE SHIELD | Admitting: Interventional Cardiology

## 2016-08-20 ENCOUNTER — Encounter: Payer: Self-pay | Admitting: Cardiology

## 2016-08-25 ENCOUNTER — Ambulatory Visit: Payer: BLUE CROSS/BLUE SHIELD | Admitting: Cardiology

## 2016-08-28 DIAGNOSIS — F419 Anxiety disorder, unspecified: Secondary | ICD-10-CM | POA: Diagnosis not present

## 2016-08-28 DIAGNOSIS — D6852 Prothrombin gene mutation: Secondary | ICD-10-CM | POA: Diagnosis not present

## 2016-08-28 DIAGNOSIS — Z7901 Long term (current) use of anticoagulants: Secondary | ICD-10-CM | POA: Diagnosis not present

## 2016-08-28 DIAGNOSIS — I2609 Other pulmonary embolism with acute cor pulmonale: Secondary | ICD-10-CM | POA: Diagnosis not present

## 2016-09-04 ENCOUNTER — Telehealth: Payer: Self-pay | Admitting: Gynecologic Oncology

## 2016-09-04 ENCOUNTER — Encounter: Payer: Self-pay | Admitting: Gynecologic Oncology

## 2016-09-04 NOTE — Telephone Encounter (Signed)
Pt has been scheduled to see Dr. Andrey Farmerossi on 3/13 at 1130am. Scheduled w/Lori from the referring office. She will contact the pt with the appt. Letter mailed to the appt.

## 2016-09-10 ENCOUNTER — Ambulatory Visit: Payer: BLUE CROSS/BLUE SHIELD | Admitting: Internal Medicine

## 2016-09-10 DIAGNOSIS — G950 Syringomyelia and syringobulbia: Secondary | ICD-10-CM

## 2016-09-10 HISTORY — DX: Syringomyelia and syringobulbia: G95.0

## 2016-09-22 ENCOUNTER — Ambulatory Visit: Payer: BLUE CROSS/BLUE SHIELD | Attending: Gynecologic Oncology | Admitting: Gynecologic Oncology

## 2016-09-22 ENCOUNTER — Encounter: Payer: Self-pay | Admitting: Gynecologic Oncology

## 2016-09-22 VITALS — BP 102/64 | HR 87 | Temp 98.2°F | Resp 18 | Ht 64.0 in | Wt 274.4 lb

## 2016-09-22 DIAGNOSIS — J449 Chronic obstructive pulmonary disease, unspecified: Secondary | ICD-10-CM | POA: Diagnosis not present

## 2016-09-22 DIAGNOSIS — Z888 Allergy status to other drugs, medicaments and biological substances status: Secondary | ICD-10-CM | POA: Insufficient documentation

## 2016-09-22 DIAGNOSIS — Z7901 Long term (current) use of anticoagulants: Secondary | ICD-10-CM | POA: Diagnosis not present

## 2016-09-22 DIAGNOSIS — Z79899 Other long term (current) drug therapy: Secondary | ICD-10-CM | POA: Insufficient documentation

## 2016-09-22 DIAGNOSIS — F419 Anxiety disorder, unspecified: Secondary | ICD-10-CM | POA: Insufficient documentation

## 2016-09-22 DIAGNOSIS — Z9889 Other specified postprocedural states: Secondary | ICD-10-CM | POA: Insufficient documentation

## 2016-09-22 DIAGNOSIS — Z8249 Family history of ischemic heart disease and other diseases of the circulatory system: Secondary | ICD-10-CM | POA: Insufficient documentation

## 2016-09-22 DIAGNOSIS — G4700 Insomnia, unspecified: Secondary | ICD-10-CM | POA: Diagnosis not present

## 2016-09-22 DIAGNOSIS — Z9049 Acquired absence of other specified parts of digestive tract: Secondary | ICD-10-CM | POA: Diagnosis not present

## 2016-09-22 DIAGNOSIS — F329 Major depressive disorder, single episode, unspecified: Secondary | ICD-10-CM | POA: Diagnosis not present

## 2016-09-22 DIAGNOSIS — Z8489 Family history of other specified conditions: Secondary | ICD-10-CM | POA: Insufficient documentation

## 2016-09-22 DIAGNOSIS — Z86711 Personal history of pulmonary embolism: Secondary | ICD-10-CM | POA: Diagnosis not present

## 2016-09-22 DIAGNOSIS — D6852 Prothrombin gene mutation: Secondary | ICD-10-CM | POA: Diagnosis not present

## 2016-09-22 DIAGNOSIS — N920 Excessive and frequent menstruation with regular cycle: Secondary | ICD-10-CM | POA: Insufficient documentation

## 2016-09-22 DIAGNOSIS — N939 Abnormal uterine and vaginal bleeding, unspecified: Secondary | ICD-10-CM

## 2016-09-22 DIAGNOSIS — I10 Essential (primary) hypertension: Secondary | ICD-10-CM | POA: Insufficient documentation

## 2016-09-22 DIAGNOSIS — Z880 Allergy status to penicillin: Secondary | ICD-10-CM | POA: Diagnosis not present

## 2016-09-22 NOTE — Progress Notes (Signed)
Consult Note: Gyn-Onc  Consult was requested by Dr. Anastasio Auerbach for the evaluation of Carrie Joyce 46 y.o. female  CC:  Chief Complaint  Patient presents with  . abnormal uterine bleeding with prothrombin gene mutation    Assessment/Plan:  Ms. Carrie Joyce  is a 46 y.o.  year old with life-long abnormal uterine bleeding (likely secondary to anovulatory cycles and obesity) who has been recently diagnosed with prothrombin gene mutation following treatment of 2 massive PE's in December, 2017 and January, 2018.  She is currently on Lovenox x 6 months and will need to be transitioned to coumadin thereafter. She is not a candidate for systemic hormonal therapy due to her prothrombotic state.  Carrie Joyce came today with some expectation that hysterectomy would be the most appropriate intervention for her symptoms of abnormal uterine bleeding. I discussed with Carrie Joyce and her mother that elective hysterectomy (or any major abdominal surgical procedure) is contraindicated in the setting of a severely prothrombotic state requiring ongoing anticoagulation. It would only be appropriate to consider such a major and risky intervention if she had an underlying diagnosis of cancer, or was hemorrhaging such that she was requiring blood transfusions and had failed non-surgical management. I described to Carrie Joyce the particular risks that she would face undergoing hysterectomy - we would need to take her off anticoagulation preoperatively for long enough to minimize major hemorrhage risk, but not too long to increase the risk for VTE events. This risk is especially high in a patient who is less than 3 months s/p PE. Additionally, minimizing the window of suspended anti-coagulation is an imperfect science, and postoperative bleeding complications are common. If this occurs, it can be life threatening, additionally, it would require suspension of additional anticoagulation until the bleeding was controlled, and in the  setting of acute post-operative state from pelvic surgery in an obese patient, this increases her risk of VTE. This is effectively a viscious cycle of risks and complications.    Instead of hysterectomy, she should discuss alternative options with her primary OBGYN, Dr Anastasio Auerbach:  1/ mirena IUD. She has used this in the past with good success in controlling her bleeding symptoms. This device is not associated with systemic absorption of progesterone and does not increase the risk of VTE events, particularly in a patient receiving active anticoagulant therapy 2/ endometrial ablation: this does not require major surgical intervention or bleeding risk, and she would not require suspension of anticoagulation to accomplish this. Repeat endometrial sampling should be performed prior to performing this. 3/ observation: while her bleeding is a major nuisance, provided that it is not associated with such profound anemia that she is requiring transfusions, it is not unreasonable to expectantly manage the bleeding, particularly as she will likely transition to menopausal amenorrhea within the next 10 years.  HPI: Carrie Joyce is a 46 year old woman who is seen in consultation at the request of Dr Anastasio Auerbach for abnormal uterine bleeding in the setting of a hypercoagulable state (prothrombin gene mutation) and recent PE's.  The patient reports that "all my life" she has had abnormal uterine bleeding. It was irregular with no defined menstrual cycles unless on OCP's. She has tried multiple agents for this over the years including a Mirena IUD in the 90's (which helped) and OCP's and most recently megace. She needed to stop the Megace abruptly in December, 2017 when she developed an acute PE (massive) requiring hospitalization and a procedure to resect the embolism. She was placed on xarelto and was  discharged to home, however developed a secondary PE in January, 2018 on Xarelto. This was treated with heparin and  lovenox and she was discharged to home on BID lovenox. She was seen by Dr Gilman Buttner who diagnosed her with a prothrombin gene mutation as the likely cause of her prothrombotic state. She was planned to stay on lovenox x 6 months, then life-long coumadin. Her megace was stopped to decrease the possibility of clot. She was told she would not be able to take hormones.  The patient's bleeding has again become irregular and bothersome since stopping megace. She bleeds most days (most days are spotting). Her periods are heavy (though she has not required blood transfusion or treatment for symptomatic anemia.  She states that she desires a hysterectomy so that she will stop bleeding.   Endometrial sampling was performed in November 2017 and was normal (inactive endometrium with no hyperplasia or carcinoma). A TVUS was performed on 05/18/16 which was also normal revealing a uterus measuring 6.6x5.7x4.6cm with a 2cm fibroid and an endometrial thickness of 8.5cm. The right ovary was normal, the left not seen.  Current Meds:  Outpatient Encounter Prescriptions as of 09/22/2016  Medication Sig  . albuterol (PROVENTIL HFA;VENTOLIN HFA) 108 (90 Base) MCG/ACT inhaler Inhale 2 puffs into the lungs every 6 (six) hours as needed for wheezing or shortness of breath.  . baclofen (LIORESAL) 10 MG tablet Take 5-10 mg by mouth 2 (two) times daily as needed for muscle spasms. Reported on 10/04/2015  . baclofen (LIORESAL) 10 MG tablet Take by mouth.  Marland Kitchen BREO ELLIPTA 100-25 MCG/INH AEPB Inhale 1 puff into the lungs daily.  . DULoxetine (CYMBALTA) 60 MG capsule Take 120 mg by mouth every morning.   . enoxaparin (LOVENOX) 120 MG/0.8ML injection   . fluticasone furoate-vilanterol (BREO ELLIPTA) 100-25 MCG/INH AEPB Inhale into the lungs.  . gabapentin (NEURONTIN) 800 MG tablet Take 800 mg by mouth 2 (two) times daily.   . hydrOXYzine (ATARAX/VISTARIL) 25 MG tablet Take 25 mg by mouth at bedtime.  Marland Kitchen linaclotide (LINZESS) 145 MCG  CAPS capsule Take 145 mcg by mouth at bedtime.  Marland Kitchen lisinopril (PRINIVIL,ZESTRIL) 10 MG tablet Take 10 mg by mouth daily.   . Menthol, Topical Analgesic, (BIOFREEZE EX) Apply 1 application topically as needed (for pain).  . Multiple Vitamin (THERA) TABS Take 1 tablet by mouth daily.  Marland Kitchen omeprazole (PRILOSEC) 20 MG capsule Take 20 mg by mouth daily as needed (for acid reflux).  . promethazine (PHENERGAN) 25 MG tablet Take 25 mg by mouth every 6 (six) hours as needed for nausea or vomiting.  . rizatriptan (MAXALT) 10 MG tablet Take 10 mg by mouth as needed for migraine. May repeat in 2 hours if needed  . topiramate (TOPAMAX) 50 MG tablet Take 100 mg by mouth at bedtime.  . [DISCONTINUED] DULoxetine (CYMBALTA) 60 MG capsule Take by mouth.  . Rivaroxaban 15 & 20 MG TBPK Start with one 15mg  tablet by mouth twice a day with food. On Day 22, switch to one 20mg  tablet once a day with food. (Patient not taking: Reported on 09/22/2016)  . [DISCONTINUED] ALPRAZolam (XANAX) 1 MG tablet Take 1 mg by mouth at bedtime as needed for anxiety or sleep.    No facility-administered encounter medications on file as of 09/22/2016.     Allergy:  Allergies  Allergen Reactions  . Ciprofloxacin Nausea And Vomiting and Hives  . Sulfa Antibiotics Hives and Swelling  . Codeine Nausea And Vomiting and Nausea Only  .  Penicillins Hives  . Prochlorperazine Itching    Social Hx:   Social History   Social History  . Marital status: Married    Spouse name: Dannielle HuhDanny  . Number of children: 1  . Years of education: College   Occupational History  . 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 Smoking status: Never Smoker  . Smokeless tobacco: Never Used  . Alcohol use 0.0 oz/week     Comment: Occasional  . Drug use: No  . Sexual activity: Yes   Other Topics Concern  . Not on file   Social History Narrative   Patient is married.   Patient is left handed   Patient does not drink caffeine   Patient has college degree     Past Surgical Hx:  Past Surgical History:  Procedure Laterality Date  . CHOLECYSTECTOMY    . IR GENERIC HISTORICAL  06/15/2016   IR ANGIOGRAM SELECTIVE EACH ADDITIONAL VESSEL 06/15/2016 Oley Balmaniel Hassell, MD [facility]  . IR GENERIC HISTORICAL  06/15/2016   IR US GUIDE VASC ACCESS RIGHT 06/15/2016 Oley Balmaniel Hassell, MD [facility]  . IR GENERIC HISTORICAL  06/15/2016   IR ANGIOGRAM SELECTIVE EACH ADDITIONAL VESSEL 06/15/2016 Oley Balmaniel Hassell, MD [facility]  . IR GENERIC HISTORICAL  06/15/2016   IR ANGIOGRAM PULMONARY BILATERAL SELECTIVE 06/15/2016 Oley Balmaniel Hassell, MD [facility]  . IR GENERIC HISTORICAL  06/15/2016   IR INFUSION THROMBOL ARTERIAL INITIAL (MS) 06/15/2016 Oley Balmaniel Hassell, MD [facility]  . IR GENERIC HISTORICAL  06/15/2016   IR INFUSION THROMBOL ARTERIAL INITIAL (MS) 06/15/2016 Oley Balmaniel Hassell, MD [facility]  . IR GENERIC HISTORICAL  06/16/2016   IR THROMB F/U EVAL ART/VEN FINAL DAY (MS) 06/16/2016 Oley Balmaniel Hassell, MD [facility]    Past Medical Hx:  Past Medical History:  Diagnosis Date  . Anxiety   . Asthma   . Cervical spinal cord injury (HCC)   . COPD (chronic obstructive pulmonary disease) (HCC)   . Depression   . Essential hypertension   . Insomnia   . Low back pain   . Myalgia     Past Gynecological History:  Irregular bleeding, secondary infertility, possible annovulatory cycles. No LMP recorded. Patient is not currently having periods (Reason: Irregular Periods).  Family Hx:  Family History  Problem Relation Age of Onset  . Hypertension Mother   . Fibromyalgia Mother   . SIDS Brother     Review of Systems:  Constitutional  Feels fatigued   ENT Normal appearing ears and nares bilaterally Skin/Breast  No rash, sores, jaundice, itching, dryness Cardiovascular  No chest pain, shortness of breath, or edema  Pulmonary  No cough or wheeze.  Gastro Intestinal  No nausea, vomitting, or diarrhoea. No bright red blood per rectum, no abdominal pain,  change in bowel movement, or constipation.  Genito Urinary  No frequency, urgency, dysuria, + irregular uterine bleeding. Musculo Skeletal  No myalgia, arthralgia, joint swelling or pain  Neurologic  No weakness, numbness, change in gait,  Psychology  No depression, anxiety, insomnia.   Vitals:  Blood pressure 102/64, pulse 87, temperature 98.2 F (36.8 C), temperature source Oral, resp. rate 18, height 5\' 4"  (1.626 m), weight 274 lb 6.4 oz (124.5 kg).  Physical Exam: WD in NAD Neck  Supple NROM, without any enlargements.  Lymph Node Survey No cervical supraclavicular or inguinal adenopathy Cardiovascular  Pulse normal rate, regularity and rhythm. S1 and S2 normal.  Lungs  Clear to auscultation bilateraly, without wheezes/crackles/rhonchi. Good air movement.  Skin  No rash/lesions/breakdown  Psychiatry  Alert and oriented to person, place, and time  Abdomen  Normoactive bowel sounds, abdomen soft, non-tender and obese without evidence of hernia.  Back No CVA tenderness Genito Urinary  Vulva/vagina: Normal external female genitalia.   No lesions. No discharge or bleeding.  Bladder/urethra:  No lesions or masses, well supported bladder  Vagina: normal  Cervix: Normal appearing, no lesions.  Uterus:  Small, mobile, no parametrial involvement or nodularity.  Adnexa: no palpable masses. Rectal  deferred Extremities  No bilateral cyanosis, clubbing or edema.   Quinn Axe, MD  09/22/2016, 12:52 PM

## 2016-09-22 NOTE — Patient Instructions (Signed)
Follow up with OB Gyn for management of uterine bleeding. No further follow up needed with Dr. Andrey Farmerossi.

## 2016-09-23 ENCOUNTER — Ambulatory Visit: Payer: BLUE CROSS/BLUE SHIELD | Admitting: Nurse Practitioner

## 2016-10-01 ENCOUNTER — Encounter: Payer: Self-pay | Admitting: Cardiology

## 2016-10-01 ENCOUNTER — Encounter (INDEPENDENT_AMBULATORY_CARE_PROVIDER_SITE_OTHER): Payer: Self-pay

## 2016-10-01 ENCOUNTER — Ambulatory Visit (INDEPENDENT_AMBULATORY_CARE_PROVIDER_SITE_OTHER): Payer: BLUE CROSS/BLUE SHIELD | Admitting: Cardiology

## 2016-10-01 VITALS — BP 104/86 | HR 83 | Ht 64.0 in | Wt 273.8 lb

## 2016-10-01 DIAGNOSIS — I2602 Saddle embolus of pulmonary artery with acute cor pulmonale: Secondary | ICD-10-CM

## 2016-10-01 DIAGNOSIS — R0602 Shortness of breath: Secondary | ICD-10-CM

## 2016-10-01 DIAGNOSIS — I517 Cardiomegaly: Secondary | ICD-10-CM

## 2016-10-01 NOTE — Progress Notes (Signed)
Cardiology Office Note    Date:  10/05/2016   ID:  Carrie Joyce, Carrie Joyce 11/22/1970, MRN [id number]  PCP:  Alinda Deem, MD  Cardiologist:  Armanda Magic, MD   Chief Complaint  Patient presents with  . New Evaluation    abnormal echo    History of Present Illness:  Carrie Joyce is a 46 y.o. female with a history of anxiety, asthma, COPD, HTN and depression who is referred by her PCP, Dr. Alinda Deem, for evaluation of RV strain after recent PE.  She was admitted 06/15/2016 with submassive PE after noticing DOE and bilateral leg pain for 10 days and then having syncopal episode.  She apparently had been on megace  for dysfunctional uterine bleeding .  Chest CT showed pulmonary embolism involving both main pulmonary arteries extending into all lobes of both lungs with right heart strain.  2D echo showed normal LVF with EF 60-65% with G1DD and moderate dilated RV with moderate RV dysfunction.    She was discharged on Xarelto and then started having more CP and SOB and presented to [facility] and was diagnosed with recurrent PE that was confirmed by another Chest CT.  She was admitted and changed to Lovenox SQ and then sent home.  She was evaluated by a Hematologist and found to be heterozygous for prothrombin gene [id number] mutation.   Due to ongoing CP at a followup visit, a CHest CT angio was done which showed a small amount of residual pulonary embolus in the prox RLL.  She says that she is still fatigued and has not had a followup 2D echo done.    She continues to have persistent SOB mainly with exertion but also has it when she lays down.  She has to sleep on 2 pillows at night due to breathing.  She does not think that her SOB has improved any since her PE.  She still has chest pain since the PE but has gotten better.  It occurs mainly when she lays down across her chest that is sharp and worse with inspiration.  Now she says that she is in the habit of taking deep breaths in  because she has been SOB for so long.  Her hematologist thought that some of her symptoms may be related to anxiety and depression.    Past Medical History:  Diagnosis Date  . Anxiety   . Asthma   . Cervical spinal cord injury (HCC)   . COPD (chronic obstructive pulmonary disease) (HCC)   . Depression   . Essential hypertension   . Insomnia   . Low back pain   . Myalgia   . Pulmonary emboli (HCC)    felt secondary to estrogen for dysfunctional uterine bleeding and clotting disorder - followed by Hematology  . Secondary right ventricular dilation 10/05/2016    Past Surgical History:  Procedure Laterality Date  . CHOLECYSTECTOMY    . IR GENERIC HISTORICAL  06/15/2016   IR ANGIOGRAM SELECTIVE EACH ADDITIONAL VESSEL 06/15/2016 Oley Balm, MD [facility]  . IR GENERIC HISTORICAL  06/15/2016   IR US GUIDE VASC ACCESS RIGHT 06/15/2016 Oley Balm, MD [facility]  . IR GENERIC HISTORICAL  06/15/2016   IR ANGIOGRAM SELECTIVE EACH ADDITIONAL VESSEL 06/15/2016 Oley Balm, MD [facility]  . IR GENERIC HISTORICAL  06/15/2016   IR ANGIOGRAM PULMONARY BILATERAL SELECTIVE 06/15/2016 Oley Balm, MD [facility]  . IR GENERIC HISTORICAL  06/15/2016   IR INFUSION THROMBOL ARTERIAL INITIAL (MS) 06/15/2016 Oley Balm,  MD [facility]  . IR GENERIC HISTORICAL  06/15/2016   IR INFUSION THROMBOL ARTERIAL INITIAL (MS) 06/15/2016 Oley Balm, MD [facility]  . IR GENERIC HISTORICAL  06/16/2016   IR THROMB F/U EVAL ART/VEN FINAL DAY (MS) 06/16/2016 Oley Balm, MD [facility]    Current Medications: Current Meds  Medication Sig  . albuterol (PROVENTIL HFA;VENTOLIN HFA) 108 (90 Base) MCG/ACT inhaler Inhale 2 puffs into the lungs every 6 (six) hours as needed for wheezing or shortness of breath.  . baclofen (LIORESAL) 10 MG tablet Take 5-10 mg by mouth 2 (two) times daily as needed for muscle spasms. Reported on 10/04/2015  . baclofen (LIORESAL) 10 MG tablet Take by mouth.  Marland Kitchen BREO  ELLIPTA 100-25 MCG/INH AEPB Inhale 1 puff into the lungs daily.  . DULoxetine (CYMBALTA) 60 MG capsule Take 120 mg by mouth every morning.   . enoxaparin (LOVENOX) 120 MG/0.8ML injection   . fluticasone furoate-vilanterol (BREO ELLIPTA) 100-25 MCG/INH AEPB Inhale into the lungs.  . hydrOXYzine (ATARAX/VISTARIL) 25 MG tablet Take 25 mg by mouth at bedtime.  Marland Kitchen linaclotide (LINZESS) 145 MCG CAPS capsule Take 145 mcg by mouth at bedtime.  Marland Kitchen lisinopril (PRINIVIL,ZESTRIL) 10 MG tablet Take 10 mg by mouth daily.   . Menthol, Topical Analgesic, (BIOFREEZE EX) Apply 1 application topically as needed (for pain).  . Multiple Vitamin (THERA) TABS Take 1 tablet by mouth daily.  Marland Kitchen omeprazole (PRILOSEC) 20 MG capsule Take 20 mg by mouth daily as needed (for acid reflux).  . promethazine (PHENERGAN) 25 MG tablet Take 25 mg by mouth every 6 (six) hours as needed for nausea or vomiting.  . rizatriptan (MAXALT) 10 MG tablet Take 10 mg by mouth as needed for migraine. May repeat in 2 hours if needed  . topiramate (TOPAMAX) 50 MG tablet Take 100 mg by mouth at bedtime.    Allergies:   Ciprofloxacin; Sulfa antibiotics; Codeine; Penicillins; and Prochlorperazine   Social History   Social History  . Marital status: Married    Spouse name: Dannielle Huh  . Number of children: 1  . Years of education: College   Occupational History  . [employer]    Social History Main Topics  . Smoking status: Never Smoker  . Smokeless tobacco: Never Used  . Alcohol use 0.0 oz/week     Comment: Occasional  . Drug use: No  . Sexual activity: Yes   Other Topics Concern  . None   Social History Narrative   Patient is married.   Patient is left handed   Patient does not drink caffeine   Patient has college degree     Family History:  The patient's family history includes Fibromyalgia in her mother; Hypertension in her mother; SIDS in her brother.   ROS:   Please see the history of present illness.    ROS All other systems  reviewed and are negative.  No flowsheet data found.     PHYSICAL EXAM:   VS:  BP 104/86   Pulse 83   Ht 5\' 4"  (1.626 m)   Wt 273 lb 12.8 oz (124.2 kg)   SpO2 99%   BMI 47.00 kg/m    GEN: Well nourished, well developed, in no acute distress  HEENT: normal  Neck: no JVD, carotid bruits, or masses Cardiac: RRR; no murmurs, rubs, or gallops,no edema.  Intact distal pulses bilaterally.  Respiratory:  clear to auscultation bilaterally, normal work of breathing GI: soft, nontender, nondistended, + BS MS: no deformity or atrophy  Skin: warm and dry, no rash Neuro:  Alert and Oriented x 3, Strength and sensation are intact Psych: euthymic mood, full affect  Wt Readings from Last 3 Encounters:  10/01/16 273 lb 12.8 oz (124.2 kg)  09/22/16 274 lb 6.4 oz (124.5 kg)  06/18/16 270 lb (122.5 kg)      Studies/Labs Reviewed:   EKG:  EKG is ordered today.  The ekg ordered today demonstrates NSR at 81bpm with RBBB  Recent Labs: 06/15/2016: B Natriuretic Peptide 60.0 06/18/2016: BUN 6; Creatinine, Ser 1.02; Hemoglobin 11.9; Magnesium 2.1; Platelets 198; Potassium 3.6; Sodium 138   Lipid Panel No results found for: CHOL, TRIG, HDL, CHOLHDL, VLDL, LDLCALC, LDLDIRECT  Additional studies/ records that were reviewed today include:  Office visit notes from PCP    ASSESSMENT:    1. Shortness of breath   2. Secondary right ventricular dilation   3. Acute saddle pulmonary embolism without acute cor pulmonale (HCC)      PLAN:  In order of problems listed above:  1. Chronic SOB - this has not resolved since her acute PEs.  I suspect that there is a component of anxiety associated with this.  Her LVF on echo was normal and there was acute RV strain.  I am going to refer her to pulmonary for further evaluation. 2. Acute RV strain secondary to acute PE - her echo has not been repeated to see if this has resolvd since treatment of her PE.  I will get a 2D echo to reassess. 3. Acute saddle  PE with acute cor pulmonale - she is now under the care of hematology for prothrombin gene mutation and is on SQ Lovenox.     Medication Adjustments/Labs and Tests Ordered: Current medicines are reviewed at length with the patient today.  Concerns regarding medicines are outlined above.  Medication changes, Labs and Tests ordered today are listed in the Patient Instructions below.  Patient Instructions  Medication Instructions:  Your physician recommends that you continue on your current medications as directed. Please refer to the Current Medication list given to you today.   Labwork: None  Testing/Procedures: Your physician has requested that you have an echocardiogram. Echocardiography is a painless test that uses sound waves to create images of your heart. It provides your doctor with information about the size and shape of your heart and how well your heart's chambers and valves are working. This procedure takes approximately one hour. There are no restrictions for this procedure.  Follow-Up: You have been referred to Dr. Marchelle Gearingamaswamy for shortness of breath/PE.  Your physician wants you to follow-up in: 6 months with Dr. Mayford Knifeurner. You will receive a reminder letter in the mail two months in advance. If you don't receive a letter, please call our office to schedule the follow-up appointment.   Any Other Special Instructions Will Be Listed Below (If Applicable).     If you need a refill on your cardiac medications before your next appointment, please call your pharmacy.      Signed, Armanda Magicraci Gwendalyn Mcgonagle, MD  10/05/2016 3:16 PM    [facility] [address], [place], [place]  [address] Phone: [phone]; Fax: [phone]

## 2016-10-01 NOTE — Patient Instructions (Signed)
Medication Instructions:  Your physician recommends that you continue on your current medications as directed. Please refer to the Current Medication list given to you today.   Labwork: None  Testing/Procedures: Your physician has requested that you have an echocardiogram. Echocardiography is a painless test that uses sound waves to create images of your heart. It provides your doctor with information about the size and shape of your heart and how well your heart's chambers and valves are working. This procedure takes approximately one hour. There are no restrictions for this procedure.  Follow-Up: You have been referred to Dr. Marchelle Gearingamaswamy for shortness of breath/PE.  Your physician wants you to follow-up in: 6 months with Dr. Mayford Knifeurner. You will receive a reminder letter in the mail two months in advance. If you don't receive a letter, please call our office to schedule the follow-up appointment.   Any Other Special Instructions Will Be Listed Below (If Applicable).     If you need a refill on your cardiac medications before your next appointment, please call your pharmacy.

## 2016-10-04 ENCOUNTER — Encounter: Payer: Self-pay | Admitting: Cardiology

## 2016-10-05 ENCOUNTER — Encounter: Payer: Self-pay | Admitting: Cardiology

## 2016-10-05 DIAGNOSIS — R0602 Shortness of breath: Secondary | ICD-10-CM | POA: Insufficient documentation

## 2016-10-05 DIAGNOSIS — I517 Cardiomegaly: Secondary | ICD-10-CM

## 2016-10-05 HISTORY — DX: Cardiomegaly: I51.7

## 2016-10-16 ENCOUNTER — Other Ambulatory Visit (HOSPITAL_COMMUNITY): Payer: BLUE CROSS/BLUE SHIELD

## 2016-10-20 ENCOUNTER — Other Ambulatory Visit: Payer: Self-pay

## 2016-10-20 ENCOUNTER — Ambulatory Visit (HOSPITAL_COMMUNITY): Payer: BLUE CROSS/BLUE SHIELD | Attending: Internal Medicine

## 2016-10-20 DIAGNOSIS — I5189 Other ill-defined heart diseases: Secondary | ICD-10-CM | POA: Insufficient documentation

## 2016-10-20 DIAGNOSIS — R0602 Shortness of breath: Secondary | ICD-10-CM

## 2016-11-13 ENCOUNTER — Ambulatory Visit (INDEPENDENT_AMBULATORY_CARE_PROVIDER_SITE_OTHER): Payer: BLUE CROSS/BLUE SHIELD | Admitting: Internal Medicine

## 2016-11-13 ENCOUNTER — Encounter: Payer: Self-pay | Admitting: Internal Medicine

## 2016-11-13 DIAGNOSIS — I2699 Other pulmonary embolism without acute cor pulmonale: Secondary | ICD-10-CM

## 2016-11-13 DIAGNOSIS — R0602 Shortness of breath: Secondary | ICD-10-CM

## 2016-11-13 DIAGNOSIS — R911 Solitary pulmonary nodule: Secondary | ICD-10-CM | POA: Insufficient documentation

## 2016-11-13 LAB — NITRIC OXIDE: Nitric Oxide: 5

## 2016-11-13 NOTE — Addendum Note (Signed)
Addended by: Sheran LuzEAST, Linzee Depaul K on: 11/13/2016 01:29 PM   Modules accepted: Orders

## 2016-11-13 NOTE — Progress Notes (Signed)
   Subjective:    Patient ID: Carrie Joyce, female    DOB: 08/29/1970, 46 y.o.   MRN: [id number]  HPI    Review of Systems  Constitutional: Negative for fever and unexpected weight change.  HENT: Negative for congestion, dental problem, ear pain, nosebleeds, postnasal drip, rhinorrhea, sinus pressure, sneezing, sore throat and trouble swallowing.   Eyes: Negative for redness and itching.  Respiratory: Positive for cough and shortness of breath. Negative for chest tightness and wheezing.   Cardiovascular: Negative for palpitations and leg swelling.  Gastrointestinal: Negative for nausea and vomiting.  Genitourinary: Negative for dysuria.  Musculoskeletal: Negative for joint swelling.  Skin: Negative for rash.  Neurological: Negative for headaches.  Hematological: Does not bruise/bleed easily.  Psychiatric/Behavioral: Negative for dysphoric mood. The patient is not nervous/anxious.        Objective:   Physical Exam        Assessment & Plan:

## 2016-11-13 NOTE — Assessment & Plan Note (Addendum)
Do VQ Scan to evaluate for chronic PE IF this is negative, will do pulmonary stress test for shortness of breath  Followup  - return after VQ scan next week or so to meet with APP to decide next step

## 2016-11-13 NOTE — Progress Notes (Signed)
Subjective:     Patient ID: Carrie Joyce, female   DOB: 03/26/1971, 46 y.o.   MRN: [id number]  HPI   IOV 11/13/2016  Chief Complaint  Patient presents with  . Pulmonary Consult    Pt referred by Dr. Armanda Magicraci Turner for SOB x 5 months. Pt states the SOB is with activity and with rest, pt also c/o chest discomfort when active - resolves with neb treatment. Pt c/o prod cough with clear mucus.     46 year old obese female. History is gained from review of the chart and talking to the patient. It appears 06/15/2016 she had bilateral pulmonary embolism. According to the patient this was [facility]. [facility] where she was seen by the critical care service. She was treated with local thrombolysis using EKOS catheter. After this she was treated with Xarelto and discharged home. She tells me that in January 2018 she had recurrent pulmonary embolism. Visualization of the CT chest report and the image shows a right lower lobe pulmonary embolism which was believed to be a recurrent acute as opposed to a 6530-day-old PE. Since then she's under the care of hematologist Dr. Melida [place]McCarthy in [place] who has her on daily Lovenox. Since then she's not had a pulmonary embolism recurrence including a CT chest February 2018 that shows resolving pulmonary embolism. Nevertheless she has had dyspnea since January 2015 present on exertion relieved by rest. It is moderate to severe. She also tells me a few days ago she had a syncopal episode and was treated [facility]. She does not recollect a CT chest there was apparently pulmonary embolism was ruled out. I do not have access to these records but it appears he was discharged on 11/10/2016 based on limited visualization of the chart that I can get. CT chest November 2018 also showed some 2 mm lung nodules in the right side   Today in the office 185 feet walking desaturation test 3 laps on room at: She did not  desaturate  Exhaled nitric oxide today in the office is normal. She denies any cough or wheezing   Chest x-ray December 2017 noted: Shows presence of local thrombolysis catheter in the setting of pulmonary embolism lung fields otherwise clear     Results for Ephriam JenkinsHELTON, Carrie ROOP (MRN [id number]) as of 11/13/2016 10:53  Ref. Range 06/16/2016 13:02 06/17/2016 04:39 06/17/2016 10:34 06/18/2016 02:37 06/18/2016 09:29  Creatinine Latest Ref Range: 0.44 - 1.00 mg/dL  0.861.04 (H)  5.781.02 (H)    Results for Ephriam JenkinsHELTON, Carrie ROOP (MRN [id number]) as of 11/13/2016 10:53  Ref. Range 06/16/2016 13:02 06/17/2016 04:39 06/17/2016 10:34 06/18/2016 02:37 06/18/2016 09:29  Hemoglobin Latest Ref Range: 12.0 - 15.0 g/dL 41.311.0 (L) 24.411.0 (L)  01.011.9 (L)    Echocardiogram report from 10/20/2016 reviewed and shows normal left ventricular ejection fraction with grade 1 diastolic dysfunction     has a past medical history of Anxiety; Asthma; Cervical spinal cord injury (HCC); COPD (chronic obstructive pulmonary disease) (HCC); Depression; Essential hypertension; Insomnia; Low back pain; Myalgia; Pulmonary emboli (HCC); and Secondary right ventricular dilation (10/05/2016).   reports that she has never smoked. She has never used smokeless tobacco.  Past Surgical History:  Procedure Laterality Date  . CHOLECYSTECTOMY    . IR GENERIC HISTORICAL  06/15/2016   IR ANGIOGRAM SELECTIVE EACH ADDITIONAL VESSEL 06/15/2016 Oley Balmaniel Hassell, MD [facility]  . IR GENERIC HISTORICAL  06/15/2016   IR US GUIDE VASC ACCESS RIGHT 06/15/2016 Oley Balmaniel Hassell,  MD [facility]  . IR GENERIC HISTORICAL  06/15/2016   IR ANGIOGRAM SELECTIVE EACH ADDITIONAL VESSEL 06/15/2016 Oley Balm, MD [facility]  . IR GENERIC HISTORICAL  06/15/2016   IR ANGIOGRAM PULMONARY BILATERAL SELECTIVE 06/15/2016 Oley Balm, MD [facility]  . IR GENERIC HISTORICAL  06/15/2016   IR INFUSION THROMBOL ARTERIAL INITIAL (MS) 06/15/2016 Oley Balm, MD [facility]  . IR GENERIC  HISTORICAL  06/15/2016   IR INFUSION THROMBOL ARTERIAL INITIAL (MS) 06/15/2016 Oley Balm, MD [facility]  . IR GENERIC HISTORICAL  06/16/2016   IR THROMB F/U EVAL ART/VEN FINAL DAY (MS) 06/16/2016 Oley Balm, MD [facility]    Allergies  Allergen Reactions  . Ciprofloxacin Nausea And Vomiting and Hives  . Sulfa Antibiotics Hives and Swelling  . Codeine Nausea And Vomiting and Nausea Only  . Penicillins Hives  . Prochlorperazine Itching     There is no immunization history on file for this patient.  Family History  Problem Relation Age of Onset  . Hypertension Mother   . Fibromyalgia Mother   . SIDS Brother      Current Outpatient Prescriptions:  .  albuterol (PROVENTIL HFA;VENTOLIN HFA) 108 (90 Base) MCG/ACT inhaler, Inhale 2 puffs into the lungs every 6 (six) hours as needed for wheezing or shortness of breath., Disp: , Rfl:  .  baclofen (LIORESAL) 10 MG tablet, Take 5-10 mg by mouth 2 (two) times daily as needed for muscle spasms. Reported on 10/04/2015, Disp: , Rfl: 0 .  BREO ELLIPTA 100-25 MCG/INH AEPB, Inhale 1 puff into the lungs daily., Disp: , Rfl: 1 .  DULoxetine (CYMBALTA) 60 MG capsule, Take 120 mg by mouth every morning. , Disp: , Rfl: 1 .  enoxaparin (LOVENOX) 120 MG/0.8ML injection, , Disp: , Rfl:  .  hydrOXYzine (ATARAX/VISTARIL) 25 MG tablet, Take 25 mg by mouth at bedtime., Disp: , Rfl:  .  linaclotide (LINZESS) 145 MCG CAPS capsule, Take 145 mcg by mouth at bedtime., Disp: , Rfl:  .  lisinopril (PRINIVIL,ZESTRIL) 10 MG tablet, Take 10 mg by mouth daily. , Disp: , Rfl: 4 .  Menthol, Topical Analgesic, (BIOFREEZE EX), Apply 1 application topically as needed (for pain)., Disp: , Rfl:  .  Multiple Vitamin (THERA) TABS, Take 1 tablet by mouth daily., Disp: , Rfl:  .  omeprazole (PRILOSEC) 20 MG capsule, Take 20 mg by mouth daily as needed (for acid reflux)., Disp: , Rfl:  .  promethazine (PHENERGAN) 25 MG tablet, Take 25 mg by mouth every 6 (six) hours as  needed for nausea or vomiting., Disp: , Rfl:  .  rizatriptan (MAXALT) 10 MG tablet, Take 10 mg by mouth as needed for migraine. May repeat in 2 hours if needed, Disp: , Rfl:  .  topiramate (TOPAMAX) 50 MG tablet, Take 100 mg by mouth at bedtime., Disp: , Rfl:     Review of Systems     Objective:   Physical Exam  Constitutional: She is oriented to person, place, and time. She appears well-developed and well-nourished. No distress.  Morbidly obese  HENT:  Head: Normocephalic and atraumatic.  Right Ear: External ear normal.  Left Ear: External ear normal.  Mouth/Throat: Oropharynx is clear and moist. No oropharyngeal exudate.  Eyes: Conjunctivae and EOM are normal. Pupils are equal, round, and reactive to light. Right eye exhibits no discharge. Left eye exhibits no discharge. No scleral icterus.  Neck: Normal range of motion. Neck supple. No JVD present. No tracheal deviation present. No thyromegaly present.  Cardiovascular: Normal rate, regular rhythm, normal heart sounds and intact distal pulses.  Exam reveals no gallop and no friction rub.   No murmur heard. Pulmonary/Chest: Effort normal and breath sounds normal. No respiratory distress. She has no wheezes. She has no rales. She exhibits no tenderness.  Abdominal: Soft. Bowel sounds are normal. She exhibits no distension and no mass. There is no tenderness. There is no rebound and no guarding.  Musculoskeletal: Normal range of motion. She exhibits no edema or tenderness.  Slow gait  Lymphadenopathy:    She has no cervical adenopathy.  Neurological: She is alert and oriented to person, place, and time. She has normal reflexes. No cranial nerve deficit. She exhibits normal muscle tone. Coordination normal.  Skin: Skin is warm and dry. No rash noted. She is not diaphoretic. No erythema. No pallor.  Psychiatric: Judgment and thought content normal.  Flat affect  Vitals reviewed.    Vitals:   11/13/16 1002  BP: 102/78  Pulse: 82   SpO2: 98%  Weight: 268 lb 3.2 oz (121.7 kg)  Height: 5\' 4"  (1.626 m)    Estimated body mass index is 46.04 kg/m as calculated from the following:   Height as of this encounter: 5\' 4"  (1.626 m).   Weight as of this encounter: 268 lb 3.2 oz (121.7 kg).      Assessment:       ICD-9-CM ICD-10-CM   1. Other acute pulmonary embolism without acute cor pulmonale (HCC) 415.19 I26.99 NM Pulmonary Per & Vent     DG Chest 2 View  2. SOB (shortness of breath) 786.05 R06.02 NM Pulmonary Per & Vent     DG Chest 2 View  3. Nodule of right lung 793.11 R91.1 CT Chest Wo Contrast       Plan:     Pulmonary emboli [facility]) Anticoagulation management per Dr Melida Quitter  SOB (shortness of breath) Do VQ Scan to evaluate for chronic PE IF this is negative, will do pulmonary stress test for shortness of breath  Followup  - return after VQ scan next week or so to meet with APP to decide next step   Nodule of right lung Do repeat ct chest wo contrast in 1 year   > 50% of this > 25 min visit spent in face to face counseling or coordination of care    Dr. Kalman Shan, M.D., [facility].C.P Pulmonary and Critical Care Medicine Staff Physician [facility] [place] Pulmonary and Critical Care Pager: [phone], If no answer or between  15:00h - 7:00h: call [phone]  11/13/2016 11:18 AM

## 2016-11-13 NOTE — Patient Instructions (Signed)
Pulmonary emboli (HCC) Anticoagulation management per Dr Melida QuitterMcCarthy  SOB (shortness of breath) Do VQ Scan to evaluate for chronic PE IF this is negative, will do pulmonary stress test for shortness of breath  Followup  - return after VQ scan next week or so to meet with APP to decide next step   Nodule of right lung Do repeat ct chest wo contrast in 1 year

## 2016-11-13 NOTE — Assessment & Plan Note (Addendum)
Anticoagulation management per Dr Melida QuitterMcCarthy

## 2016-11-13 NOTE — Assessment & Plan Note (Signed)
Do repeat ct chest wo contrast in 1 year

## 2016-11-19 ENCOUNTER — Encounter (HOSPITAL_COMMUNITY): Admission: RE | Admit: 2016-11-19 | Payer: BLUE CROSS/BLUE SHIELD | Source: Ambulatory Visit

## 2016-11-19 ENCOUNTER — Ambulatory Visit (HOSPITAL_COMMUNITY): Admission: RE | Admit: 2016-11-19 | Payer: BLUE CROSS/BLUE SHIELD | Source: Ambulatory Visit

## 2016-11-23 ENCOUNTER — Ambulatory Visit: Payer: BLUE CROSS/BLUE SHIELD | Admitting: Adult Health

## 2016-11-27 ENCOUNTER — Encounter (HOSPITAL_COMMUNITY)
Admission: RE | Admit: 2016-11-27 | Discharge: 2016-11-27 | Disposition: A | Discharge status: Home / Self Care | Payer: BLUE CROSS/BLUE SHIELD | Source: Ambulatory Visit | Attending: Internal Medicine | Admitting: Internal Medicine

## 2016-11-27 ENCOUNTER — Telehealth: Payer: Self-pay | Admitting: Internal Medicine

## 2016-11-27 ENCOUNTER — Ambulatory Visit (HOSPITAL_COMMUNITY)
Admission: RE | Admit: 2016-11-27 | Discharge: 2016-11-27 | Disposition: A | Discharge status: Home / Self Care | Payer: BLUE CROSS/BLUE SHIELD | Source: Ambulatory Visit | Attending: Internal Medicine | Admitting: Internal Medicine

## 2016-11-27 DIAGNOSIS — D6852 Prothrombin gene mutation: Secondary | ICD-10-CM | POA: Diagnosis not present

## 2016-11-27 DIAGNOSIS — Z86711 Personal history of pulmonary embolism: Secondary | ICD-10-CM | POA: Insufficient documentation

## 2016-11-27 DIAGNOSIS — I2699 Other pulmonary embolism without acute cor pulmonale: Secondary | ICD-10-CM

## 2016-11-27 DIAGNOSIS — R0602 Shortness of breath: Secondary | ICD-10-CM | POA: Diagnosis not present

## 2016-11-27 DIAGNOSIS — Z803 Family history of malignant neoplasm of breast: Secondary | ICD-10-CM | POA: Diagnosis not present

## 2016-11-27 MED ORDER — TECHNETIUM TC 99M DIETHYLENETRIAME-PENTAACETIC ACID
43.0000 | Freq: Once | INTRAVENOUS | Status: AC | PRN
  Administered 2016-11-27: 43 via INTRAVENOUS

## 2016-11-27 MED ORDER — TECHNETIUM TO 99M ALBUMIN AGGREGATED
3.3000 | Freq: Once | INTRAVENOUS | Status: AC | PRN
  Administered 2016-11-27: 3.3 via INTRAVENOUS

## 2016-11-27 NOTE — Telephone Encounter (Signed)
lmtcb X1 for pt.   [facility]

## 2016-11-27 NOTE — Telephone Encounter (Signed)
VQ scan normal. No PE  Plan Do CPST bike test with EIB challeng and return to see me or APP for fu  Dr. Kalman ShanMurali Katerra Ingman, M.D., [facility].C.P Pulmonary and Critical Care Medicine Staff Physician [facility] [place] Pulmonary and Critical Care Pager: [phone], If no answer or between  15:00h - 7:00h: call [phone]  11/27/2016 4:16 PM      Dg Chest 2 View  Result Date: 11/27/2016 CLINICAL DATA:  Shortness of breath, hypertension, history of previous pulmonary emboli EXAM: CHEST  2 VIEW COMPARISON:  06/16/2016, 08/28/2016 FINDINGS: The heart size and mediastinal contours are within normal limits. Both lungs are clear. The visualized skeletal structures are unremarkable. IMPRESSION: No active cardiopulmonary disease. Electronically Signed   By: Judie PetitM.  Shick M.D.   On: 11/27/2016 09:50   Nm Pulmonary Per & Vent  Result Date: 11/27/2016 CLINICAL DATA:  History pulmonary embolism. Factor 5 Leiden disorder. EXAM: NUCLEAR MEDICINE VENTILATION - PERFUSION LUNG SCAN TECHNIQUE: Ventilation images were obtained in multiple projections using inhaled aerosol Tc-5057m DTPA. Perfusion images were obtained in multiple projections after intravenous injection of Tc-2657m MAA. RADIOPHARMACEUTICALS:  Forty-three mCi Technetium-2957m DTPA aerosol inhalation and 3.3 mCi Technetium-7257m MAA IV COMPARISON:  Radiograph 11/28/2014, CT 08/28/2016 FINDINGS: Ventilation: No focal ventilation defect. Perfusion: No wedge shaped peripheral perfusion defects to suggest acute pulmonary embolism. IMPRESSION: No evidence of residual pulmonary embolism. No acute pulmonary embolism. Electronically Signed   By: Genevive BiStewart  Edmunds M.D.   On: 11/27/2016 12:30

## 2016-12-04 NOTE — Telephone Encounter (Signed)
LMTCB

## 2016-12-04 NOTE — Telephone Encounter (Signed)
Error.Stanley A Dalton ° °

## 2016-12-08 NOTE — Telephone Encounter (Signed)
Spoke with the pt and notified of results per MR  She verbalized understanding  [facility]

## 2016-12-08 NOTE — Telephone Encounter (Signed)
Pt returning call for results.Carrie Joyce ° °

## 2016-12-17 ENCOUNTER — Ambulatory Visit (HOSPITAL_COMMUNITY): Payer: BLUE CROSS/BLUE SHIELD | Attending: Internal Medicine

## 2016-12-17 DIAGNOSIS — R0602 Shortness of breath: Secondary | ICD-10-CM | POA: Diagnosis not present

## 2016-12-24 DIAGNOSIS — R0600 Dyspnea, unspecified: Secondary | ICD-10-CM | POA: Diagnosis not present

## 2016-12-31 ENCOUNTER — Ambulatory Visit: Payer: BLUE CROSS/BLUE SHIELD | Admitting: Adult Health

## 2017-02-01 ENCOUNTER — Ambulatory Visit (INDEPENDENT_AMBULATORY_CARE_PROVIDER_SITE_OTHER): Payer: BLUE CROSS/BLUE SHIELD | Admitting: Adult Health

## 2017-02-01 ENCOUNTER — Encounter: Payer: Self-pay | Admitting: Adult Health

## 2017-02-01 VITALS — BP 116/84 | HR 73 | Ht 64.0 in | Wt 274.0 lb

## 2017-02-01 DIAGNOSIS — M79605 Pain in left leg: Secondary | ICD-10-CM | POA: Diagnosis not present

## 2017-02-01 DIAGNOSIS — I2699 Other pulmonary embolism without acute cor pulmonale: Secondary | ICD-10-CM | POA: Diagnosis not present

## 2017-02-01 DIAGNOSIS — M79604 Pain in right leg: Secondary | ICD-10-CM

## 2017-02-01 DIAGNOSIS — R0602 Shortness of breath: Secondary | ICD-10-CM | POA: Diagnosis not present

## 2017-02-01 DIAGNOSIS — I82411 Acute embolism and thrombosis of right femoral vein: Secondary | ICD-10-CM

## 2017-02-01 NOTE — Assessment & Plan Note (Signed)
Persistent Dyspnea  With neg VQ scan for PE/chronic PE . She had acute PE in 06/2016 (Assume provoked as on Estrogen and Megace ) tx w/ Xarelto . She required CPR and there was evidence of right heart strain . She underwent EKOS . Unfortunately she had recurrent PE in Jan 2018 while on Xarelto . She was found to have Factor V Leiden and was treated with 6 months of full dose Lovenox . She is now off anticoagulation since end of May 2018 . She underwent VQ scan that is neg for PE and chronic PE . Recent echo shows no PAH or evidence of persistent right heart strain. CT chest in May 2018 w/ no PE .  CPST shows no evidence of exercise induced asthma . Lung function is normal . EKG w/ NSR and ST changes/Arrhythmias. Did show obesity contributing to her exercise intolerance /deconditioning component.-w/ chronotropic incompetence. There was mild circulatory limitation/Pulmonary vascular disease.  She has a complicated hx with syringomyelia w/ decreased exercise and deconditioning due to muscle weakness and chronic pain.  She is at risk for recurrent PE so will need to continue to follow closely .  She is to continue to follow with cardiology .   Plan  Patient Instructions  We are setting you up for a Venous Doppler .  Continue on BREO 1 puff daily . Rinse after use  Follow up with Dr. Marchelle Gearingamaswamy in 3 months and As needed

## 2017-02-01 NOTE — Patient Instructions (Signed)
We are setting you up for a Venous Doppler .  Continue on BREO 1 puff daily . Rinse after use  Follow up with Dr. Marchelle Gearingamaswamy in 3 months and As needed

## 2017-02-01 NOTE — Progress Notes (Signed)
@Patient  ID: Carrie Joyce, female    DOB: 1970-12-25, 46 y.o.   MRN: [id number]  Chief Complaint  Patient presents with  . Follow-up    dyspnea     Referring provider: Alinda Deem, MD  HPI: 46 yo female seen for pulmonary consult 11/13/16 for dyspnea . She was dx with PE in 06/2016 tx w/ EKOS , discharged on Xarelto. Recurrent PE in 07/2016 . Followed by Hematology  She has Syringomyelia followed at [facility] by Neurology.   TEST  CT chest 06/2017 +PE  CT chest 07/2016 New Acute PE -recurrent  CT chest 08/2016 resolving PE .  Walk test 11/2016 neg for desats  FENO 11/2016 nml  Echo 10/2016 nml EF , GR 1 DD .  11/10/16 [facility] CT chest neg for PE (Care Everywhere)   02/01/2017 Follow up: PE , Dyspnea  Pt returns for follow up for persistent dyspnea.  Pt has hx of PE dx in 06/2016 while on Megace/Estrogen  (for dysfunctional uterine bleeding). She was tx with EKOS and Xarelto . She had worsening dyspnea in Jan 2018 and CT chest showed new PE felt to be recurrent PE while on Xarelto (compliant) . She was changed to full dose Lovenox injections  And referred to Hematology.  She has been found to have Factor V Leiden . Megace and Estrogen were stopped.  She was treated with Lovenox for 6 months. Despite tx for PE she has remained sob with minimal activity . She was set up for a VQ scan that was neg for PE /Chronic PE .  CPST done on 12/24/16 showed nml lung function with no evidence to support exercise induced asthma. Felt body habitus played a role in exercise intolerance . Chronotropic incompetence . Suggestive of mild circulatory limitation/pulmonary vascular disease  She follows with cardiology and follow up echo in 10/2016 showed mild LVH, gr 1 DD , no evidence of RV enlargement or strain.  Pt says she has not been active in last few years due to chronic pain, muscular weakness due to Syringomyelia .  Complains over last 1 month of ankle edema and intermittent leg /calf pain .  Previous  DVT in right  leg per pt. She has been off full dose lovenox since end of May 2018.  She denies chest pain, orthopnea, fever , syncope, rash , or fever.      Allergies  Allergen Reactions  . Ciprofloxacin Nausea And Vomiting and Hives  . Sulfa Antibiotics Hives and Swelling  . Codeine Nausea And Vomiting and Nausea Only  . Penicillins Hives  . Prochlorperazine Itching     There is no immunization history on file for this patient.  Past Medical History:  Diagnosis Date  . Anxiety   . Asthma   . Cervical spinal cord injury (HCC)   . COPD (chronic obstructive pulmonary disease) (HCC)   . Depression   . Essential hypertension   . Insomnia   . Low back pain   . Myalgia   . Pulmonary emboli (HCC)    felt secondary to estrogen for dysfunctional uterine bleeding and clotting disorder - followed by Hematology  . Secondary right ventricular dilation 10/05/2016    Tobacco History: History  Smoking Status  . Never Smoker  Smokeless Tobacco  . Never Used   Counseling given: Not Answered   Outpatient Encounter Prescriptions as of 02/01/2017  Medication Sig  . albuterol (PROVENTIL HFA;VENTOLIN HFA) 108 (90 Base) MCG/ACT inhaler Inhale 2 puffs into the lungs every  6 (six) hours as needed for wheezing or shortness of breath.  . baclofen (LIORESAL) 10 MG tablet Take 5-10 mg by mouth 2 (two) times daily as needed for muscle spasms. Reported on 10/04/2015  . BREO ELLIPTA 100-25 MCG/INH AEPB Inhale 1 puff into the lungs daily.  . cloNIDine (CATAPRES) 0.1 MG tablet 1 tablet by mouth as needed for BP > 150/100  . DULoxetine (CYMBALTA) 60 MG capsule Take 120 mg by mouth every morning.   . hydrochlorothiazide (HYDRODIURIL) 25 MG tablet Take 25 mg by mouth daily.  . hydrOXYzine (ATARAX/VISTARIL) 25 MG tablet Take 25 mg by mouth at bedtime.  Marland Kitchen. linaclotide (LINZESS) 145 MCG CAPS capsule Take 145 mcg by mouth at bedtime.  . Menthol, Topical Analgesic, (BIOFREEZE EX) Apply 1 application  topically as needed (for pain).  . Multiple Vitamin (THERA) TABS Take 1 tablet by mouth daily.  . promethazine (PHENERGAN) 25 MG tablet Take 25 mg by mouth every 6 (six) hours as needed for nausea or vomiting.  . rizatriptan (MAXALT) 10 MG tablet Take 10 mg by mouth as needed for migraine. May repeat in 2 hours if needed  . omeprazole (PRILOSEC) 20 MG capsule Take 20 mg by mouth daily as needed (for acid reflux).  . [DISCONTINUED] enoxaparin (LOVENOX) 120 MG/0.8ML injection   . [DISCONTINUED] lisinopril (PRINIVIL,ZESTRIL) 10 MG tablet Take 10 mg by mouth daily.   . [DISCONTINUED] topiramate (TOPAMAX) 50 MG tablet Take 100 mg by mouth at bedtime.   No facility-administered encounter medications on file as of 02/01/2017.      Review of Systems  Constitutional:   No  weight loss, night sweats,  Fevers, chills,  +fatigue, or  lassitude.  HEENT:   No headaches,  Difficulty swallowing,  Tooth/dental problems, or  Sore throat,                No sneezing, itching, ear ache, nasal congestion, post nasal drip,   CV:  No chest pain,  Orthopnea, PND,   anasarca, dizziness, palpitations, syncope.   GI  No heartburn, indigestion, abdominal pain, nausea, vomiting, diarrhea, change in bowel habits, loss of appetite, bloody stools.   Resp:    No excess mucus, no productive cough,  No non-productive cough,  No coughing up of blood.  No change in color of mucus.  No wheezing.  No chest wall deformity  Skin: no rash or lesions.  GU: no dysuria, change in color of urine, no urgency or frequency.  No flank pain, no hematuria   MS:  No joint pain or swelling.  No decreased range of motion.  No back pain.    Physical Exam  BP 116/84 (BP Location: Left Arm, Cuff Size: Normal)   Pulse 73   Ht 5\' 4"  (1.626 m)   Wt 274 lb (124.3 kg)   SpO2 100%   BMI 47.03 kg/m   GEN: A/Ox3; pleasant , NAD, obese    HEENT:  [place]/AT,  EACs-clear, TMs-wnl, NOSE-clear, THROAT-clear, no lesions, no postnasal drip or  exudate noted. Class 2 MP airway   NECK:  Supple w/ fair ROM; no JVD; normal carotid impulses w/o bruits; no thyromegaly or nodules palpated; no lymphadenopathy.    RESP  Clear  P & A; w/o, wheezes/ rales/ or rhonchi. no accessory muscle use, no dullness to percussion  CARD:  RRR, no m/r/g, no peripheral edema, pulses intact, no cyanosis or clubbing. Tenderness along lower legs and ankles, neg Homans sign   GI:   Soft & nt;  nml bowel sounds; no organomegaly or masses detected.   Musco: Warm bil, no deformities or joint swelling noted.   Neuro: alert, no focal deficits noted.    Skin: Warm, no lesions or rashes    Lab Results:  CBC  BNP    Component Value Date/Time   BNP 60.0 06/15/2016 0433    ProBNP No results found for: PROBNP  Imaging: No results found.   Assessment & Plan:   SOB (shortness of breath) Persistent Dyspnea  With neg VQ scan for PE/chronic PE . She had acute PE in 06/2016 (Assume provoked as on Estrogen and Megace ) tx w/ Xarelto . She required CPR and there was evidence of right heart strain . She underwent EKOS . Unfortunately she had recurrent PE in Jan 2018 while on Xarelto . She was found to have Factor V Leiden and was treated with 6 months of full dose Lovenox . She is now off anticoagulation since end of May 2018 . She underwent VQ scan that is neg for PE and chronic PE . Recent echo shows no PAH or evidence of persistent right heart strain. CT chest in May 2018 w/ no PE .  CPST shows no evidence of exercise induced asthma . Lung function is normal . EKG w/ NSR and ST changes/Arrhythmias. Did show obesity contributing to her exercise intolerance /deconditioning component.-w/ chronotropic incompetence. There was mild circulatory limitation/Pulmonary vascular disease.  She has a complicated hx with syringomyelia w/ decreased exercise and deconditioning due to muscle weakness and chronic pain.  She is at risk for recurrent PE so will need to continue to  follow closely .  She is to continue to follow with cardiology .   Plan  Patient Instructions  We are setting you up for a Venous Doppler .  Continue on BREO 1 puff daily . Rinse after use  Follow up with Dr. Marchelle Gearing in 3 months and As needed      Pulmonary emboli [facility]) Previous PE 06/2016 -provoked on Megace /Estrogen  She was found to have Factor V Leiden and is followed by Hematology with recurrent PE in 07/2016 .she completed  6 months of full dose Lovenox .  She has has several CT chest over last 6 months , most recent May 2018 with no evidence of PE . VQ scan neg for PE or chronic PE .  Cont to follow with hematology   Plan  Patient Instructions  We are setting you up for a Venous Doppler .  Continue on BREO 1 puff daily . Rinse after use  Follow up with Dr. Marchelle Gearing in 3 months and As needed      Right leg DVT (HCC) Pt with Right Leg DVT s/p 6 months of Lovenox .  Factor V Leiden noted.  She is complaining of leg pain and intermittent swelling  Check venous dopplers bilateral legs.      Rubye Oaks, NP 02/01/2017

## 2017-02-01 NOTE — Assessment & Plan Note (Signed)
Pt with Right Leg DVT s/p 6 months of Lovenox .  Factor V Leiden noted.  She is complaining of leg pain and intermittent swelling  Check venous dopplers bilateral legs.

## 2017-02-01 NOTE — Assessment & Plan Note (Signed)
Previous PE 06/2016 -provoked on Megace /Estrogen  She was found to have Factor V Leiden and is followed by Hematology with recurrent PE in 07/2016 .she completed  6 months of full dose Lovenox .  She has has several CT chest over last 6 months , most recent May 2018 with no evidence of PE . VQ scan neg for PE or chronic PE .  Cont to follow with hematology   Plan  Patient Instructions  We are setting you up for a Venous Doppler .  Continue on BREO 1 puff daily . Rinse after use  Follow up with Dr. Marchelle Gearingamaswamy in 3 months and As needed

## 2017-02-04 ENCOUNTER — Ambulatory Visit (HOSPITAL_COMMUNITY)
Admission: RE | Admit: 2017-02-04 | Discharge: 2017-02-04 | Disposition: A | Discharge status: Home / Self Care | Payer: BLUE CROSS/BLUE SHIELD | Source: Ambulatory Visit | Attending: Cardiology | Admitting: Cardiology

## 2017-02-04 DIAGNOSIS — M79604 Pain in right leg: Secondary | ICD-10-CM | POA: Diagnosis not present

## 2017-02-04 DIAGNOSIS — M79605 Pain in left leg: Secondary | ICD-10-CM | POA: Diagnosis not present

## 2017-02-04 DIAGNOSIS — I82411 Acute embolism and thrombosis of right femoral vein: Secondary | ICD-10-CM | POA: Diagnosis not present

## 2017-02-17 ENCOUNTER — Ambulatory Visit: Payer: BLUE CROSS/BLUE SHIELD | Admitting: Internal Medicine

## 2017-02-18 ENCOUNTER — Telehealth: Payer: Self-pay | Admitting: Adult Health

## 2017-02-18 NOTE — Telephone Encounter (Signed)
lmomtcb x1 

## 2017-02-19 NOTE — Telephone Encounter (Signed)
  Parrett, Virgel Bouquetammy S, NP sent to Lowell BoutonJones, Jessica E, CMA        NO DVT seen  Previous DVT has resolved  Cont w/ ov recs  Please contact office for sooner follow up if symptoms do not improve or worsen or seek emergency care    [facility]

## 2017-02-19 NOTE — Telephone Encounter (Signed)
Pt returning call

## 2017-02-19 NOTE — Telephone Encounter (Signed)
lmtcb X2 for pt.  

## 2017-02-19 NOTE — Telephone Encounter (Signed)
Pt returning call from yesterday °

## 2017-02-19 NOTE — Telephone Encounter (Signed)
Spoke with pt and notified of results per Rubye Oaksammy Parrett, NP (negative venous dopplers). Pt verbalized understanding of results. She states that she is having a lot of increased SOB and is "blacking out every time I bend over". I advised that the needs to go to ED, for eval, as this can not be fixed over the phone, and needs to be re-evaluated asap. She got upset that I told her that the problem could not be fixed over the phone. I explained to her that the symptoms she described that she was having concerned me, and this is why I rec that she head to the ED. She then states "why, what are they gonna do, they sent me to you first. I can't breathe and I am seeing stars when I bend over".  I again advised best option is to go to the ED. She states "I am just wasting my time" and then the call ended.

## 2017-02-19 NOTE — Progress Notes (Signed)
See 02/19/17 phone note.  

## 2017-04-12 ENCOUNTER — Ambulatory Visit: Payer: BLUE CROSS/BLUE SHIELD | Admitting: Internal Medicine

## 2017-05-03 ENCOUNTER — Ambulatory Visit: Payer: BLUE CROSS/BLUE SHIELD | Admitting: Internal Medicine

## 2017-06-07 DIAGNOSIS — M461 Sacroiliitis, not elsewhere classified: Secondary | ICD-10-CM | POA: Insufficient documentation

## 2017-07-13 HISTORY — PX: BARIATRIC SURGERY: SHX1103

## 2017-08-18 ENCOUNTER — Telehealth: Payer: Self-pay | Admitting: Cardiology

## 2017-08-18 NOTE — Telephone Encounter (Signed)
Pt states that she has been having pain in her left side of her chest for the last 2 days, score "6-7" pt states it gets worse when she takes a deep breath, it hurts a lot. Pt states that the chest pain is the same and feels like when she had a pulmonary embolus. Pt also states that both LE are swelling. It hurts and has a burning sensation, the pain and burning sensation  gets worse when she takes her stocking off. Now  pt  feels the same pain and burning sensation on her arms and hand. Pt was made aware that she needs to go to the ER because she may needs  have a  PE and she needs to be checked for that. Pt verbalized understanding

## 2017-08-18 NOTE — Telephone Encounter (Signed)
Pt c/o of Chest Pain: 1. Are you having CP right now? Yes   2. Are you experiencing any other symptoms (ex. SOB, nausea, vomiting, sweating)? SOB  3. How long have you been experiencing CP? A couple of days   4. Is your CP continuous or coming and going?coming and going  5. Have you taken Nitroglycerin?no

## 2017-09-02 ENCOUNTER — Ambulatory Visit: Payer: BLUE CROSS/BLUE SHIELD | Admitting: Cardiology

## 2017-09-24 ENCOUNTER — Ambulatory Visit: Payer: BLUE CROSS/BLUE SHIELD | Admitting: Cardiology

## 2017-09-24 DIAGNOSIS — R0989 Other specified symptoms and signs involving the circulatory and respiratory systems: Secondary | ICD-10-CM

## 2017-09-28 ENCOUNTER — Encounter: Payer: Self-pay | Admitting: Cardiology

## 2017-10-22 ENCOUNTER — Other Ambulatory Visit (HOSPITAL_COMMUNITY): Payer: Self-pay | Admitting: General Surgery

## 2017-11-12 ENCOUNTER — Ambulatory Visit
Admission: RE | Admit: 2017-11-12 | Discharge: 2017-11-12 | Disposition: A | Discharge status: Home / Self Care | Payer: BLUE CROSS/BLUE SHIELD | Source: Ambulatory Visit | Attending: Internal Medicine | Admitting: Internal Medicine

## 2017-11-12 DIAGNOSIS — R911 Solitary pulmonary nodule: Secondary | ICD-10-CM

## 2017-11-15 DIAGNOSIS — D6852 Prothrombin gene mutation: Secondary | ICD-10-CM | POA: Diagnosis not present

## 2017-11-15 DIAGNOSIS — Z86711 Personal history of pulmonary embolism: Secondary | ICD-10-CM | POA: Diagnosis not present

## 2017-11-15 DIAGNOSIS — Z803 Family history of malignant neoplasm of breast: Secondary | ICD-10-CM | POA: Diagnosis not present

## 2017-11-15 DIAGNOSIS — E669 Obesity, unspecified: Secondary | ICD-10-CM | POA: Diagnosis not present

## 2017-11-17 ENCOUNTER — Other Ambulatory Visit: Payer: Self-pay

## 2017-11-17 ENCOUNTER — Ambulatory Visit (HOSPITAL_COMMUNITY)
Admission: RE | Admit: 2017-11-17 | Discharge: 2017-11-17 | Disposition: A | Discharge status: Home / Self Care | Payer: BLUE CROSS/BLUE SHIELD | Source: Ambulatory Visit | Attending: General Surgery | Admitting: General Surgery

## 2017-11-17 DIAGNOSIS — Z01810 Encounter for preprocedural cardiovascular examination: Secondary | ICD-10-CM | POA: Insufficient documentation

## 2017-11-17 DIAGNOSIS — I4510 Unspecified right bundle-branch block: Secondary | ICD-10-CM | POA: Insufficient documentation

## 2017-11-17 DIAGNOSIS — R9431 Abnormal electrocardiogram [ECG] [EKG]: Secondary | ICD-10-CM | POA: Insufficient documentation

## 2017-11-17 DIAGNOSIS — Z01818 Encounter for other preprocedural examination: Secondary | ICD-10-CM | POA: Diagnosis not present

## 2017-11-30 ENCOUNTER — Telehealth: Payer: Self-pay | Admitting: Internal Medicine

## 2017-11-30 NOTE — Telephone Encounter (Signed)
Lung nodules are stable - No further CT needed   IMPRESSION: 1.  No acute process in the chest. 2. Similar bilateral pulmonary nodules, most consistent with subpleural lymph nodes. Given size and stability over greater than 1 year, these can be presumed benign. 3. Hepatic steatosis.   Electronically Signed   By: Jeronimo Greaves M.D.   On: 11/12/2017 14:07

## 2017-12-08 NOTE — Telephone Encounter (Signed)
Spoke with pt, aware of results/recs.  Nothing further needed.  

## 2018-02-03 ENCOUNTER — Encounter

## 2018-02-03 ENCOUNTER — Ambulatory Visit: Payer: BLUE CROSS/BLUE SHIELD | Admitting: Obstetrics & Gynecology

## 2018-02-03 ENCOUNTER — Ambulatory Visit: Payer: Self-pay | Admitting: Clinical

## 2018-02-03 ENCOUNTER — Encounter: Payer: Self-pay | Admitting: Obstetrics & Gynecology

## 2018-02-03 VITALS — BP 139/94 | HR 72 | Wt 269.6 lb

## 2018-02-03 DIAGNOSIS — N938 Other specified abnormal uterine and vaginal bleeding: Secondary | ICD-10-CM | POA: Diagnosis not present

## 2018-02-03 DIAGNOSIS — F419 Anxiety disorder, unspecified: Secondary | ICD-10-CM

## 2018-02-03 NOTE — Progress Notes (Signed)
Pt c/o of pain in her left side of her pelvic area, states that pain is constant and is not going away with any relief. States that pain is a stabbing pain and cannot be relieved.   Needs to see Behavioral Health Asher Muir(Jamie) for Elevated PHQ9/GAD7

## 2018-02-03 NOTE — Patient Instructions (Signed)

## 2018-02-03 NOTE — BH Specialist Note (Signed)
Integrated Behavioral Health Initial Visit  MRN: [id number] Name: Carrie Joyce  Number of Integrated Behavioral Health Clinician visits:: 1/6 Session Start time: 3:10 Session End time: 3:25 Total time: 15 minutes  Type of Service: Integrated Behavioral Health- Individual/Family Interpretor:No. Interpretor Name and Language: n/a   Warm Hand Off Completed.       SUBJECTIVE: Carrie Joyce is a 47 y.o. female accompanied by 17yo daughter Patient was referred by Scheryl DarterJames Arnold, MD for anxiety and depression. Patient reports the following symptoms/concerns: Pt states her primary concern today is that her BH medications aren't working as well to manage her symptoms as expected; also feeling stress over having to cut off contact with her mother(w schizophrenia).  Duration of problem: Ongoing for decades; Severity of problem: severe  OBJECTIVE: Mood: Anxious and Affect: Appropriate Risk of harm to self or others: No plan to harm self or others  LIFE CONTEXT: Family and Social: Pt lives with her husband and 17yo daughter School/Work: - Self-Care: Beginning to set  healthy boundaries with her mother Life Changes: Cut off contact with mother recently  GOALS ADDRESSED: Patient will: 1. Reduce symptoms of: anxiety, depression, insomnia and stress 2. Increase knowledge and/or ability of: self-management skills and stress reduction  3. Demonstrate ability to: Increase healthy adjustment to current life circumstances and Increase adequate support systems for patient/family  INTERVENTIONS: Interventions utilized: Supportive Counseling, Psychoeducation and/or Health Education and Link to [employer]  Standardized Assessments completed: GAD-7 and PHQ 9  ASSESSMENT: Patient currently experiencing Unspecified anxiety disorder   Patient may benefit from psychoeducation and brief therapeutic interventions regarding coping with symptoms of anxiety and  depression .  PLAN: 1. Follow up with behavioral health clinician on : As needed 2. Behavioral recommendations:  -Contact [employer] to send list of  psychiatrists/[place]rap[place] who [place] -Discuss BH medication concern with PCP, including possibility of genetic counseling -Consider [facility] support [place], for additional family support in caring for mother -Read educational materials regarding coping with symptoms of anxiety and depression -Consider apps for additional family self-coping(self and daughter) 3. Referral(s): Integrated Behavioral Health Services (In Clinic) 4. "From scale of 1-10, how likely are you to follow plan?": -  Rae LipsJamie C McMannes, LCSW  Depression screen [facility] 2/[facility] 02/03/2018  Decreased Interest 3  Down, Depressed, Hopeless 3  PHQ - 2 Score 6  Altered sleeping 2  Tired, decreased energy 3  Change in appetite 3  Feeling bad or failure about yourself  1  Trouble concentrating 2  Moving slowly or fidgety/restless 3  Suicidal thoughts 0  PHQ-9 Score 20   GAD 7 : Generalized Anxiety Score 02/03/2018  Nervous, Anxious, on Edge 3  Control/stop worrying 3  Worry too much - different things 3  Trouble relaxing 3  Restless 1  Easily annoyed or irritable 3  Afraid - awful might happen 0  Total GAD 7 Score 16

## 2018-02-03 NOTE — Progress Notes (Signed)
Patient ID: Carrie Joyce, female   DOB: 12/05/1970, 47 y.o.   MRN: [id number]  Chief Complaint  Patient presents with  . Abdominal Pain  LLQ sharp pain for more than 2 years. DUB  HPI Carrie Joyce is a 47 y.o. female.  No obstetric history on file. Patient's last menstrual period was 01/23/2018 (exact date). S/P BTL. Has had sharp intermittent LLQ pain for more than 2 years and has seen GYN, Dr Andrey Farmer, urology. She has DUB with menses for up to 14-17 days.  HPI  Past Medical History:  Diagnosis Date  . Anxiety   . Asthma   . Cervical spinal cord injury (HCC)   . COPD (chronic obstructive pulmonary disease) (HCC)   . Depression   . Essential hypertension   . Insomnia   . Low back pain   . Myalgia   . Pulmonary emboli (HCC)    felt secondary to estrogen for dysfunctional uterine bleeding and clotting disorder - followed by Hematology  . Secondary right ventricular dilation 10/05/2016    Past Surgical History:  Procedure Laterality Date  . CHOLECYSTECTOMY    . IR GENERIC HISTORICAL  06/15/2016   IR ANGIOGRAM SELECTIVE EACH ADDITIONAL VESSEL 06/15/2016 Oley Balm, MD [facility]  . IR GENERIC HISTORICAL  06/15/2016   IR US GUIDE VASC ACCESS RIGHT 06/15/2016 Oley Balm, MD [facility]  . IR GENERIC HISTORICAL  06/15/2016   IR ANGIOGRAM SELECTIVE EACH ADDITIONAL VESSEL 06/15/2016 Oley Balm, MD [facility]  . IR GENERIC HISTORICAL  06/15/2016   IR ANGIOGRAM PULMONARY BILATERAL SELECTIVE 06/15/2016 Oley Balm, MD [facility]  . IR GENERIC HISTORICAL  06/15/2016   IR INFUSION THROMBOL ARTERIAL INITIAL (MS) 06/15/2016 Oley Balm, MD [facility]  . IR GENERIC HISTORICAL  06/15/2016   IR INFUSION THROMBOL ARTERIAL INITIAL (MS) 06/15/2016 Oley Balm, MD [facility]  . IR GENERIC HISTORICAL  06/16/2016   IR THROMB F/U EVAL ART/VEN FINAL DAY (MS) 06/16/2016 Oley Balm, MD [facility]    Family History  Problem Relation Age of Onset  . Hypertension  Mother   . Fibromyalgia Mother   . SIDS Brother     Social History Social History   Tobacco Use  . Smoking status: Never Smoker  . Smokeless tobacco: Never Used  Substance Use Topics  . Alcohol use: Yes    Alcohol/week: 0.0 oz    Comment: Occasional  . Drug use: No    Allergies  Allergen Reactions  . Ciprofloxacin Nausea And Vomiting and Hives  . Sulfa Antibiotics Hives and Swelling  . Codeine Nausea And Vomiting and Nausea Only  . Penicillins Hives  . Prochlorperazine Itching    Current Outpatient Medications  Medication Sig Dispense Refill  . albuterol (PROVENTIL HFA;VENTOLIN HFA) 108 (90 Base) MCG/ACT inhaler Inhale 2 puffs into the lungs every 6 (six) hours as needed for wheezing or shortness of breath.    . baclofen (LIORESAL) 10 MG tablet Take 5-10 mg by mouth 2 (two) times daily as needed for muscle spasms. Reported on 10/04/2015  0  . DULoxetine (CYMBALTA) 60 MG capsule Take 120 mg by mouth every morning.   1  . hydrOXYzine (ATARAX/VISTARIL) 25 MG tablet Take 25 mg by mouth at bedtime.    Marland Kitchen linaclotide (LINZESS) 145 MCG CAPS capsule Take 145 mcg by mouth at bedtime.    . Menthol, Topical Analgesic, (BIOFREEZE EX) Apply 1 application topically as needed (for pain).    . Multiple Vitamin (THERA) TABS Take 1 tablet by  mouth daily.    Marland Kitchen. omeprazole (PRILOSEC) 20 MG capsule Take 20 mg by mouth daily as needed (for acid reflux).    . promethazine (PHENERGAN) 25 MG tablet Take 25 mg by mouth every 6 (six) hours as needed for nausea or vomiting.    . rizatriptan (MAXALT) 10 MG tablet Take 10 mg by mouth as needed for migraine. May repeat in 2 hours if needed    . BREO ELLIPTA 100-25 MCG/INH AEPB Inhale 1 puff into the lungs daily.  1  . cloNIDine (CATAPRES) 0.1 MG tablet 1 tablet by mouth as needed for BP > 150/100    . hydrochlorothiazide (HYDRODIURIL) 25 MG tablet Take 25 mg by mouth daily.     No current facility-administered medications for this visit.     Review of  Systems Review of Systems  Constitutional: Negative.   Respiratory: Negative.   Gastrointestinal: Positive for constipation and diarrhea. Negative for anal bleeding.  Endocrine: Negative.   Genitourinary: Positive for flank pain, menstrual problem, pelvic pain and vaginal bleeding.    Blood pressure (!) 139/94, pulse 72, weight 269 lb 9.6 oz (122.3 kg), last menstrual period 01/23/2018.  Physical Exam Physical Exam  Constitutional: She appears well-developed. No distress.  Cardiovascular: Normal rate.  Pulmonary/Chest: Effort normal.  Abdominal: Soft. Normal appearance. She exhibits no mass. There is tenderness in the left lower quadrant. There is no guarding.  Vitals reviewed.   Data Reviewed Dr Oliver Humossi's note 09/2016 US and CT results Assessment    RLQ pain not gyn origin DUB Patient Active Problem List   Diagnosis Date Noted  . Nodule of right lung 11/13/2016  . SOB (shortness of breath) 10/05/2016  . Secondary right ventricular dilation 10/05/2016  . Right leg DVT (HCC) 06/18/2016  . Acute respiratory failure with hypoxia (HCC) 06/18/2016  . Syncope and collapse 06/18/2016  . Pulmonary emboli (HCC) 06/15/2016  . DUB (dysfunctional uterine bleeding) 10/04/2015       Plan    Avoid systemic hormonal therapy. Progesterone IUD is recommended and she would like to schedule this for DUB managemen       Scheryl DarterJames Hrishikesh Hoeg 02/03/2018, 3:06 PM

## 2018-03-03 ENCOUNTER — Encounter: Payer: Self-pay | Admitting: Obstetrics & Gynecology

## 2018-03-03 ENCOUNTER — Ambulatory Visit (INDEPENDENT_AMBULATORY_CARE_PROVIDER_SITE_OTHER): Payer: BLUE CROSS/BLUE SHIELD | Admitting: Obstetrics & Gynecology

## 2018-03-03 VITALS — BP 128/87 | HR 80 | Ht 64.0 in | Wt 271.7 lb

## 2018-03-03 DIAGNOSIS — Z3202 Encounter for pregnancy test, result negative: Secondary | ICD-10-CM | POA: Diagnosis not present

## 2018-03-03 DIAGNOSIS — Z30430 Encounter for insertion of intrauterine contraceptive device: Secondary | ICD-10-CM | POA: Diagnosis not present

## 2018-03-03 LAB — POCT PREGNANCY, URINE: Preg Test, Ur: NEGATIVE

## 2018-03-03 MED ORDER — LEVONORGESTREL 19.5 MCG/DAY IU IUD
INTRAUTERINE_SYSTEM | Freq: Once | INTRAUTERINE | Status: AC
  Administered 2018-03-03: 10:00:00 via INTRAUTERINE

## 2018-03-03 NOTE — Progress Notes (Signed)
    GYNECOLOGY OFFICE PROCEDURE NOTE  Carrie Joyce is a 47 y.o. [id number] here for [place] IUD insertion. She is sterilized and request the device for DUB.  Last pap smear was several years ago and was normal. She had cervical dysplasia and CKC in 1999.  IUD Insertion Procedure Note Patient identified, informed consent performed, consent signed.   Discussed risks of irregular bleeding, cramping, infection, malpositioning or misplacement of the IUD outside the uterus which may require further procedure such as laparoscopy. Time out was performed.  Urine pregnancy test negative.  Speculum placed in the vagina.  Cervix visualized. Scarring noted c/w CKC. Cleaned with Betadine x 2.  Grasped anteriorly with a single tooth tenaculum.  Cervix was dilated to confirm interna os was open. Uterus sounded to 9 cm.  Liletta IUD placed per manufacturer's recommendations.  Strings trimmed to 3 cm. Tenaculum was removed, good hemostasis noted.  Patient tolerated procedure well.   Patient was given post-procedure instructions. Patient was also asked to check IUD strings periodically and follow up in 4 weeks for IUD check.   Scheryl DarterJAMES ARNOLD, MD, FACOG Attending Obstetrician & Gynecologist, [facility]

## 2018-03-03 NOTE — Patient Instructions (Signed)
Levonorgestrel intrauterine device (IUD), Liletta What is this medicine? LEVONORGESTREL IUD (LEE voe nor jes trel) is a contraceptive (birth control) device. The device is placed inside the uterus by a healthcare professional. It is used to prevent pregnancy. This device can also be used to treat heavy bleeding that occurs during your period. This medicine may be used for other purposes; ask your health care provider or pharmacist if you have questions. COMMON BRAND NAME(S): Cameron Ali What should I tell my health care provider before I take this medicine? They need to know if you have any of these conditions: -abnormal Pap smear -cancer of the breast, uterus, or cervix -diabetes -endometritis -genital or pelvic infection now or in the past -have more than one sexual partner or your partner has more than one partner -heart disease -history of an ectopic or tubal pregnancy -immune system problems -IUD in place -liver disease or tumor -problems with blood clots or take blood-thinners -seizures -use intravenous drugs -uterus of unusual shape -vaginal bleeding that has not been explained -an unusual or allergic reaction to levonorgestrel, other hormones, silicone, or polyethylene, medicines, foods, dyes, or preservatives -pregnant or trying to get pregnant -breast-feeding How should I use this medicine? This device is placed inside the uterus by a health care professional. Talk to your pediatrician regarding the use of this medicine in children. Special care may be needed. Overdosage: If you think you have taken too much of this medicine contact a poison control center or emergency room at once. NOTE: This medicine is only for you. Do not share this medicine with others. What if I miss a dose? This does not apply. Depending on the brand of device you have inserted, the device will need to be replaced every 3 to 5 years if you wish to continue using this type of birth  control. What may interact with this medicine? Do not take this medicine with any of the following medications: -amprenavir -bosentan -fosamprenavir This medicine may also interact with the following medications: -aprepitant -armodafinil -barbiturate medicines for inducing sleep or treating seizures -bexarotene -boceprevir -griseofulvin -medicines to treat seizures like carbamazepine, ethotoin, felbamate, oxcarbazepine, phenytoin, topiramate -modafinil -pioglitazone -rifabutin -rifampin -rifapentine -some medicines to treat HIV infection like atazanavir, efavirenz, indinavir, lopinavir, nelfinavir, tipranavir, ritonavir -St. John's wort -warfarin This list may not describe all possible interactions. Give your health care provider a list of all the medicines, herbs, non-prescription drugs, or dietary supplements you use. Also tell them if you smoke, drink alcohol, or use illegal drugs. Some items may interact with your medicine. What should I watch for while using this medicine? Visit your doctor or health care professional for regular check ups. See your doctor if you or your partner has sexual contact with others, becomes HIV positive, or gets a sexual transmitted disease. This product does not protect you against HIV infection (AIDS) or other sexually transmitted diseases. You can check the placement of the IUD yourself by reaching up to the top of your vagina with clean fingers to feel the threads. Do not pull on the threads. It is a good habit to check placement after each menstrual period. Call your doctor right away if you feel more of the IUD than just the threads or if you cannot feel the threads at all. The IUD may come out by itself. You may become pregnant if the device comes out. If you notice that the IUD has come out use a backup birth control method like condoms and call  your health care provider. Using tampons will not change the position of the IUD and are okay to use  during your period. This IUD can be safely scanned with magnetic resonance imaging (MRI) only under specific conditions. Before you have an MRI, tell your healthcare provider that you have an IUD in place, and which type of IUD you have in place. What side effects may I notice from receiving this medicine? Side effects that you should report to your doctor or health care professional as soon as possible: -allergic reactions like skin rash, itching or hives, swelling of the face, lips, or tongue -fever, flu-like symptoms -genital sores -high blood pressure -no menstrual period for 6 weeks during use -pain, swelling, warmth in the leg -pelvic pain or tenderness -severe or sudden headache -signs of pregnancy -stomach cramping -sudden shortness of breath -trouble with balance, talking, or walking -unusual vaginal bleeding, discharge -yellowing of the eyes or skin Side effects that usually do not require medical attention (report to your doctor or health care professional if they continue or are bothersome): -acne -breast pain -change in sex drive or performance -changes in weight -cramping, dizziness, or faintness while the device is being inserted -headache -irregular menstrual bleeding within first 3 to 6 months of use -nausea This list may not describe all possible side effects. Call your doctor for medical advice about side effects. You may report side effects to FDA at 1-800-FDA-1088. Where should I keep my medicine? This does not apply. NOTE: This sheet is a summary. It may not cover all possible information. If you have questions about this medicine, talk to your doctor, pharmacist, or health care provider.  2018 Elsevier/Gold Standard (2016-04-10 14:14:56)

## 2018-04-07 ENCOUNTER — Ambulatory Visit: Payer: Self-pay | Admitting: Obstetrics & Gynecology

## 2018-04-15 DIAGNOSIS — G8929 Other chronic pain: Secondary | ICD-10-CM | POA: Insufficient documentation

## 2018-04-15 DIAGNOSIS — M542 Cervicalgia: Secondary | ICD-10-CM | POA: Insufficient documentation

## 2018-05-09 ENCOUNTER — Telehealth: Payer: Self-pay | Admitting: Skilled Nursing Facility1

## 2018-05-09 NOTE — Telephone Encounter (Signed)
Pt called to ask where [facility] was for pre-op class. At this point dietitian realized pt did not have an assessment or a referral for [facility]. Dietitian explained to the pt she needs a referral from [facility] and an assessment with [facility]  before she can have the pre-op class.  Pt accepted this information.

## 2018-05-13 DIAGNOSIS — M47816 Spondylosis without myelopathy or radiculopathy, lumbar region: Secondary | ICD-10-CM

## 2018-05-13 HISTORY — DX: Spondylosis without myelopathy or radiculopathy, lumbar region: M47.816

## 2018-05-19 DIAGNOSIS — M47817 Spondylosis without myelopathy or radiculopathy, lumbosacral region: Secondary | ICD-10-CM | POA: Insufficient documentation

## 2018-05-23 ENCOUNTER — Encounter: Payer: Self-pay | Admitting: Skilled Nursing Facility1

## 2018-05-23 ENCOUNTER — Encounter: Payer: BLUE CROSS/BLUE SHIELD | Attending: General Surgery | Admitting: Skilled Nursing Facility1

## 2018-05-23 DIAGNOSIS — Z713 Dietary counseling and surveillance: Secondary | ICD-10-CM | POA: Insufficient documentation

## 2018-05-23 DIAGNOSIS — Z6841 Body Mass Index (BMI) 40.0 and over, adult: Secondary | ICD-10-CM | POA: Insufficient documentation

## 2018-05-23 DIAGNOSIS — E669 Obesity, unspecified: Secondary | ICD-10-CM
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
## 2018-05-24 DIAGNOSIS — M47816 Spondylosis without myelopathy or radiculopathy, lumbar region: Secondary | ICD-10-CM | POA: Insufficient documentation

## 2018-05-30 ENCOUNTER — Encounter: Payer: Self-pay | Admitting: Skilled Nursing Facility1

## 2018-05-30 ENCOUNTER — Encounter: Payer: BLUE CROSS/BLUE SHIELD | Admitting: Skilled Nursing Facility1

## 2018-05-30 DIAGNOSIS — E669 Obesity, unspecified: Secondary | ICD-10-CM

## 2018-05-30 DIAGNOSIS — Z713 Dietary counseling and surveillance: Secondary | ICD-10-CM | POA: Diagnosis not present

## 2018-05-30 NOTE — Progress Notes (Addendum)
Pre-Operative Nutrition Class:  Appt start time: 1730   End time:  1830.  Patient was seen on 05/30/2018 for Pre-Operative Bariatric Surgery Education [facility].   Pt arrives stating she has been vomiting. Pt states she is feeling okay for now and does not need further assistance.   Surgery date:  Surgery type: RYGB Start weight at [facility]: 278.9 Weight today: 277.7  Samples given per MNT protocol. Patient educated on appropriate usage: Surgery date:  Surgery type: sleeve Start weight at [facility]: 322 Weight today: 335.5  Samples given per MNT protocol. Patient educated on appropriate usage: Bariatric Advantage Multivitamin Lot # [id number] Exp: 4/21  Bariatric Advantage Calcium  Lot #  [id number]  Unjury Protein Powder  Shake Lot # [id number]   [id number] Exp: 03/21       March 7 20   The following the learning objectives were met by the patient during this course:  Identify Pre-Op Dietary Goals and will begin 2 weeks pre-operatively  Identify appropriate sources of fluids and proteins   State protein recommendations and appropriate sources pre and post-operatively  Identify Post-Operative Dietary Goals and will follow for 2 weeks post-operatively  Identify appropriate multivitamin and calcium sources  Describe the need for physical activity post-operatively and will follow MD recommendations  State when to call healthcare provider regarding medication questions or post-operative complications  Handouts given during class include:  Pre-Op Bariatric Surgery Diet Handout  Protein Shake Handout  Post-Op Bariatric Surgery Nutrition Handout  [facility] Program Information Flyer  Support Group Information Flyer  [facility] Bariatric Supplements Price List  Follow-Up Plan: Patient will follow-up at [facility] 2 weeks post operatively for diet advancement per MD.    

## 2018-06-01 ENCOUNTER — Telehealth: Payer: Self-pay | Admitting: Skilled Nursing Facility1
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
## 2018-06-06 ENCOUNTER — Encounter: Payer: BLUE CROSS/BLUE SHIELD | Admitting: Skilled Nursing Facility1

## 2018-06-06 ENCOUNTER — Encounter: Payer: Self-pay | Admitting: Skilled Nursing Facility1

## 2018-06-06 DIAGNOSIS — E669 Obesity, unspecified: Secondary | ICD-10-CM

## 2018-06-06 DIAGNOSIS — Z713 Dietary counseling and surveillance: Secondary | ICD-10-CM | POA: Diagnosis not present
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
## 2018-06-08 ENCOUNTER — Ambulatory Visit: Payer: Self-pay | Admitting: Skilled Nursing Facility1

## 2018-06-13 SURGERY — Surgical Case
Anesthesia: *Unknown

## 2018-06-14 ENCOUNTER — Encounter (HOSPITAL_COMMUNITY): Payer: Self-pay

## 2018-06-14 ENCOUNTER — Other Ambulatory Visit: Payer: Self-pay

## 2018-06-14 ENCOUNTER — Encounter (HOSPITAL_COMMUNITY)
Admission: RE | Admit: 2018-06-14 | Discharge: 2018-06-14 | Disposition: A | Discharge status: Home / Self Care | Payer: BLUE CROSS/BLUE SHIELD | Source: Ambulatory Visit | Attending: General Surgery | Admitting: General Surgery

## 2018-06-14 DIAGNOSIS — Z01812 Encounter for preprocedural laboratory examination: Secondary | ICD-10-CM | POA: Insufficient documentation

## 2018-06-14 HISTORY — DX: Spondylosis without myelopathy or radiculopathy, lumbar region: M47.816

## 2018-06-14 HISTORY — DX: Sprain of medial collateral ligament of unspecified knee, initial encounter: S83.419A

## 2018-06-14 HISTORY — DX: Dyspnea, unspecified: R06.00

## 2018-06-14 HISTORY — DX: Migraine, unspecified, not intractable, without status migrainosus: G43.909

## 2018-06-14 HISTORY — DX: Anesthesia of skin: R20.2

## 2018-06-14 HISTORY — DX: Disease of blood and blood-forming organs, unspecified: D75.9

## 2018-06-14 HISTORY — DX: Headache: R51

## 2018-06-14 HISTORY — DX: Activated protein C resistance: D68.51

## 2018-06-14 HISTORY — DX: Repeated falls: R29.6

## 2018-06-14 HISTORY — DX: Sprain of anterior cruciate ligament of unspecified knee, initial encounter: S83.519A

## 2018-06-14 HISTORY — DX: Nontoxic multinodular goiter: E04.2

## 2018-06-14 HISTORY — DX: Anesthesia of skin: R20.0

## 2018-06-14 HISTORY — DX: Syringomyelia and syringobulbia: G95.0

## 2018-06-14 HISTORY — DX: Interstitial cystitis (chronic) without hematuria: N30.10

## 2018-06-14 HISTORY — DX: Headache, unspecified: R51.9

## 2018-06-14 HISTORY — DX: Fibromyalgia: M79.7

## 2018-06-14 LAB — CBC
HEMATOCRIT: 44.6 % (ref 36.0–46.0)
Hemoglobin: 14.6 g/dL (ref 12.0–15.0)
MCH: 30 pg (ref 26.0–34.0)
MCHC: 32.7 g/dL (ref 30.0–36.0)
MCV: 91.6 fL (ref 80.0–100.0)
Platelets: 338 10*3/uL (ref 150–400)
RBC: 4.87 MIL/uL (ref 3.87–5.11)
RDW: 13.1 % (ref 11.5–15.5)
WBC: 9.8 10*3/uL (ref 4.0–10.5)
nRBC: 0 % (ref 0.0–0.2)

## 2018-06-14 LAB — BASIC METABOLIC PANEL
Anion gap: 9 (ref 5–15)
BUN: 12 mg/dL (ref 6–20)
CHLORIDE: 106 mmol/L (ref 98–111)
CO2: 22 mmol/L (ref 22–32)
CREATININE: 0.98 mg/dL (ref 0.44–1.00)
Calcium: 8.9 mg/dL (ref 8.9–10.3)
GFR calc Af Amer: >60 mL/min (ref >60)
GFR calc non Af Amer: >60 mL/min (ref >60)
Glucose, Bld: 102 mg/dL — ABNORMAL HIGH (ref 70–99)
Potassium: 4 mmol/L (ref 3.5–5.1)
SODIUM: 137 mmol/L (ref 135–145)

## 2018-06-14 LAB — HCG, SERUM, QUALITATIVE: Preg, Serum: NEGATIVE

## 2018-06-14 NOTE — Patient Instructions (Signed)
Carrie Joyce  06/14/2018   Your procedure is scheduled on: 06/21/2018   [facility]  Report to admitting at    0830 AM    Call this number if you have problems the morning of surgery [phone]   Remember: Do not eat food or drink liquids :After Midnight. BRUSH YOUR TEETH MORNING OF SURGERY AND RINSE YOUR MOUTH OUT, NO CHEWING GUM CANDY OR MINTS.     Take these medicines the morning of surgery with A SIP OF WATER: Inhalers as usual and bring, Prilosec if needed, Cymbalta, Metoprolol, Topamax                                 You may not have any metal on your body including hair pins and              piercings  Do not wear jewelry, make-up, lotions, powders or perfumes, deodorant             Do not wear nail polish.  Do not shave  48 hours prior to surgery.                Do not bring valuables to the hospital. [facility] IS NOT             RESPONSIBLE   FOR VALUABLES.  Contacts, dentures or bridgework may not be worn into surgery.  Leave suitcase in the car. After surgery it may be brought to your room.                   Please read over the following fact sheets you were given: _____________________________________________________________________             [facility] - Preparing [facility] Before surgery, you can play an important role.  Because skin is not sterile, your skin needs to be as free of germs as possible.  You can reduce the number of germs on your skin by washing with CHG (chlorahexidine gluconate) soap before surgery.  CHG is an antiseptic cleaner which kills germs and bonds with the skin to continue killing germs even after washing. Please DO NOT use if you have an allergy to CHG or antibacterial soaps.  If your skin becomes reddened/irritated stop using the CHG and inform your nurse when you arrive at Short Stay. Do not shave (including legs and underarms) for at least 48 hours prior to the first CHG  shower.  You may shave your face/neck. Please follow these instructions carefully:  1.  Shower with CHG Soap the night before surgery and the  morning of Surgery.  2.  If you choose to wash your hair, wash your hair first as usual with your  normal  shampoo.  3.  After you shampoo, rinse your hair and body thoroughly to remove the  shampoo.                           4.  Use CHG as you would any other liquid soap.  You can apply chg directly  to the skin and wash                       Gently with a scrungie or clean washcloth.  5.  Apply the  CHG Soap to your body ONLY FROM THE NECK DOWN.   Do not use on face/ open                           Wound or open sores. Avoid contact with eyes, ears mouth and genitals (private parts).                       Wash face,  Genitals (private parts) with your normal soap.             6.  Wash thoroughly, paying special attention to the area where your surgery  will be performed.  7.  Thoroughly rinse your body with warm water from the neck down.  8.  DO NOT shower/wash with your normal soap after using and rinsing off  the CHG Soap.                9.  Pat yourself dry with a clean towel.            10.  Wear clean pajamas.            11.  Place clean sheets on your bed the night of your first shower and do not  sleep with pets. Day of Surgery : Do not apply any lotions/deodorants the morning of surgery.  Please wear clean clothes to the hospital/surgery center.  FAILURE TO FOLLOW THESE INSTRUCTIONS MAY RESULT IN THE CANCELLATION OF YOUR SURGERY PATIENT SIGNATURE_________________________________  NURSE SIGNATURE__________________________________  ________________________________________________________________________

## 2018-06-14 NOTE — Progress Notes (Signed)
Need orders in epic  Preop on 06/14/2018 at 300pm.

## 2018-06-14 NOTE — Progress Notes (Signed)
   06/14/18 1559  OBSTRUCTIVE SLEEP APNEA  Have you ever been diagnosed with sleep apnea through a sleep study? No  Do you snore loudly (loud enough to be heard through closed doors)?  1  Do you often feel tired, fatigued, or sleepy during the daytime (such as falling asleep during driving or talking to someone)? 0  Has anyone observed you stop breathing during your sleep? 1  Do you have, or are you being treated for high blood pressure? 1  BMI more than 35 kg/m2? 1  Age > 50 (1-yes) 0  Neck circumference greater than:Female 16 inches or larger, Female 17inches or larger? 1  Female Gender (Yes=1) 0  Obstructive Sleep Apnea Score 5

## 2018-06-15 ENCOUNTER — Ambulatory Visit: Payer: Self-pay | Admitting: General Surgery

## 2018-06-15 ENCOUNTER — Telehealth: Payer: Self-pay | Admitting: Skilled Nursing Facility1

## 2018-06-15 NOTE — Progress Notes (Signed)
Obtained fax number for [facility].  Faxed medical release form to obtain clearance and most recent office visit note from Dr Gilman ButtnerMcCarty for patient's upcoming  surgery

## 2018-06-15 NOTE — Telephone Encounter (Signed)
Pt called to stating she needs another pre-op folder.   Dietitian said she would leave it at the front for her

## 2018-06-15 NOTE — H&P (Signed)
Carrie Joyce Documented: 06/15/2018 11:19 AM [facility] Patient #: [id number] DOB: 03-05-1971 Married / Language: English / Race: White Female  History of Present Illness Carrie Areola M. Carrie Johanson MD; 06/15/2018 12:12 PM) The patient is a 47 year old female who presents for a bariatric surgery evaluation. She comes in today for her preoperative visit. She has been approved for laparoscopic sleeve gastrectomy. She denies any significant medical changes since she was last seen such as trips to the emergency room or hospital. She does state that she was recently diagnosed with reflux issues. She reports that she was having some hoarse voice and [facility] physician and they did a fiberoptic scope and saw some swollen vocal cords and he believes that it is to do reflux. She was placed on reflux medication. She had a steroid injection in her lower back for ongoing spinal issues. She states that she is in the process of getting approved for a stimulator. Physical activity is still limited. She denies any chest pain, chest pressure, shortness of breath, TIAs or amaurosis fugax. She denies any recurrent blood clots. We did receive clearance from her hematologist for weight loss surgery. She was heterozygous for factor II mutation. This causes a mild increase in risk for thrombosis but does not require lifelong anticoagulation. She recommended Lovenox 1 mg/kg twice a day for 1 month postop. She had a normal CBC and metabolic panel on May 6 at [facility]. Her upper GI was unremarkable. Chest x-ray was unremarkable. She has seen the dietitian and psychologist.   10/2017 She is referred by Dr Theresa Mulligan at [facility] for evaluation of weight loss surgery. She is accompanied by family member. She completed our seminar on line. She is interested in adjustable gastric band. She is interested in a less invasive weight loss procedure because of her medical history. She wants to improve her  health since she can move better and hopefully help relieve some of her chronic back pain. She has a friend who underwent Roux-en-Y gastric bypass surgery. Despite numerous attempts for sustained weight loss she has been unsuccessful. She has tried Weight Watchers, Paleo diet, nutrition classes, physical activity, phentermine-all without any long-term success.  Her physical activity is limited due to her chronic back pain at times. 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 She will occasionally get a little bit of dyspnea on exertion. She denies any paroxysmal nocturnal dyspnea. She states that she will get short of breath if she lays flat. She reports 2 prior PEs. She has some occasional bilateral edema. She states that she had a negative sleep study and aspirin. She denies any reflux. She reports a daily bowel movement she has had a cholecystectomy. She alternates between diarrhea and constipation about twice a month. Her gastroenterologist is Dr. Chales Abrahams. She denies any dysuria or hematuria. She has 6-8 migraines per month. She takes Topamax and Maxalt. She denies any tobacco or drug use. She will occasionally have a glass of wine.   Problem List/Past Medical Carrie Areola M. Carrie Campanile, MD; 06/15/2018 12:14 PM) SYRINGOMYELIA (G95.0) HISTORY OF IBS (Z87.19) HEARTBURN (R12) MORBID OBESITY (E66.01) We rediscussed weight loss  surgery in the typical hospitalization and postoperative course. She was given her risk and outcome profile from the [facility] surgical risk calculator PROTHROMBIN GENE MUTATION  [phone])  Past Surgical History Carrie Areola M. Carrie Campanile, MD; 06/15/2018 12:14 PM) Gallbladder Surgery - Laparoscopic  Diagnostic Studies History Carrie Areola M. Carrie Campanile, MD; 06/15/2018 12:14 PM) Colonoscopy 1-5 years ago Mammogram 1-3 years ago Pap Smear 1-5 years ago  Allergies Carrie Joyce, CMA; 06/15/2018 11:20 AM) Penicillins Difficulty breathing. Allergies Reconciled Sulfur *DERMATOLOGICALS* Diarrhea. Codeine Sulfate *ANALGESICS - OPIOID* Chest pain, Dermatitis, Diarrhea, Difficulty breathing.  Medication History Carrie Joyce, CMA; 06/15/2018 11:22 AM) Potassium Chloride Crys ER ( Tablet ER, Oral) Active. HydrOXYzine HCl (25MG  Tablet, Oral) Active. Albuterol Sulfate (0.63MG /3ML Nebulized Soln, Inhalation) Active. Trelegy Ellipta (100-62.5-25MCG/INH Aero Pow Br Act, Inhalation) Active. Topiramate (50MG  Tablet, Oral) Active. predniSONE (5MG  Tablet, Oral) Active. Montelukast Sodium (10MG  Tablet, Oral) Active. Omeprazole (40MG  Capsule DR, Oral) Active. Ondansetron (4MG  Tablet Disint, Oral) Active. Metoprolol Succinate ER (25MG  Tablet ER 24HR, Oral) Active. Gabapentin (800MG  Tablet, Oral) Active. Fluconazole (150MG  Tablet, Oral) Active. metroNIDAZOLE (0.75% Gel, Vaginal) Active. Cyclobenzaprine HCl (10MG  Tablet, Oral) Active. DULoxetine HCl (60MG  Capsule DR Part, Oral) Active. Baclofen (10MG  Tablet, Oral) Active. levoFLOXacin (750MG  Tablet, Oral) Active. Medications Reconciled  Social History Carrie Areola M. Carrie Campanile, MD; 06/15/2018 12:14 PM) Alcohol use Occasional alcohol use. Caffeine use Tea. No drug use Tobacco use Never smoker.  Family History Carrie Areola M. Carrie Campanile, MD; 06/15/2018 12:14 PM) Hypertension Mother.  Pregnancy / Birth History Carrie Areola M. Carrie Campanile, MD; 06/15/2018 12:14 PM) Age at menarche 14 years. Gravida 3 Irregular periods Length (months) of breastfeeding 3-6 Maternal age 33-25 Para 1  Other Problems Carrie Areola M. Carrie Campanile, MD; 06/15/2018 12:14  PM) Anxiety Disorder Chest pain Depression Other disease, cancer, significant illness HYPERTENSION, ESSENTIAL (I10) CHRONIC MIDLINE LOW BACK PAIN WITH LEFT-SIDED SCIATICA (M54.42) HEADACHE, VARIANT MIGRAINE (G43.809) HISTORY OF PULMONARY EMBOLISM (Z86.711)     Review of Systems Carrie Areola M. Gerianne Simonet MD; 06/15/2018 12:13 PM) General Not Present- Appetite Loss, Chills, Fatigue, Fever, Night Sweats, Weight Gain and Weight Loss. Skin Not Present- Change in Wart/Mole, Dryness, Hives, Jaundice, New Lesions, Non-Healing Wounds, Rash and Ulcer. HEENT Not Present- Earache, Hearing Loss, Hoarseness, Nose Bleed, Oral Ulcers, Ringing in the Ears, Seasonal Allergies, Sinus Pain, Sore Throat, Visual Disturbances, Wears glasses/contact lenses and Yellow Eyes. Respiratory Not Present- Bloody sputum, Chronic Cough, Difficulty Breathing, Snoring and Wheezing. Breast Not Present- Breast Mass, Breast Pain, Nipple Discharge and Skin Changes. Cardiovascular Not Present- Chest Pain, Difficulty Breathing Lying Down, Leg Cramps, Palpitations, Rapid Heart Rate, Shortness of Breath and Swelling of Extremities. Gastrointestinal Not Present- Abdominal Pain, Bloating, Bloody Stool, Change in Bowel Habits, Chronic diarrhea, Constipation, Difficulty Swallowing, Excessive gas, Gets full quickly at meals, Hemorrhoids, Indigestion, Nausea, Rectal Pain and Vomiting. Female Genitourinary Not Present- Frequency, Nocturia, Painful Urination, Pelvic Pain and Urgency. Musculoskeletal Present- Back Pain. Not Present- Joint Pain, Joint Stiffness, Muscle Pain, Muscle Weakness and Swelling of Extremities. Neurological Present- Headaches, Numbness and Tingling. Not Present- Decreased Memory, Fainting, Seizures, Tremor, Trouble walking and Weakness. Psychiatric Present- Anxiety. Not Present- Bipolar, Change in Sleep Pattern, Depression, Fearful and Frequent crying. Endocrine Not Present- Cold Intolerance, Excessive Hunger, Hair Changes,  Heat Intolerance, Hot flashes and New Diabetes. Hematology Not Present- Blood Thinners, Easy Bruising, Excessive bleeding, Gland problems, HIV and Persistent Infections. All other systems negative  Vitals (Sabrina Canty CMA; 06/15/2018 11:23 AM) 06/15/2018 11:22 AM Weight: 276.38 lb Height: 64in Body Surface Area: 2.24 m Body Mass Index: 47.44 kg/m  Temp.: 97.39F  Pulse: 85 (Regular)  P.OX: 99% (Room air) BP: 102/60 (Sitting, Left Arm, Standard)      Physical Exam Carrie Areola(Aleksi Brummet M. Georgie Haque MD; 06/15/2018 12:13 PM)  General Mental Status-Alert. General Appearance-Consistent with stated age. Hydration-Well hydrated. Voice-Normal. Note: severe obesity  Head and Neck Head-normocephalic, atraumatic with no lesions or palpable masses. Trachea-midline. Thyroid Gland Characteristics - normal size and consistency.  Eye Eyeball - Bilateral-Extraocular movements intact. Sclera/Conjunctiva - Bilateral-No scleral icterus.  ENMT Ears -Note:normal external ears.  Mouth and Throat -Note:lips intact.   Chest and Lung Exam Chest and lung exam reveals -quiet, even and easy respiratory effort with no use of accessory muscles and on auscultation, normal breath sounds, no adventitious sounds and normal vocal resonance. Inspection Chest Wall - Normal. Back - normal.  Breast - Did not examine.  Cardiovascular Cardiovascular examination reveals -normal heart sounds, regular rate and rhythm with no murmurs and normal pedal pulses bilaterally.  Abdomen Inspection Inspection of the abdomen reveals - No Hernias. Skin - Scar - Note: old trocar scars. Palpation/Percussion Palpation and Percussion of the abdomen reveal - Soft, Non Tender, No Rebound tenderness, No Rigidity (guarding) and No hepatosplenomegaly. Auscultation Auscultation of the abdomen reveals - Bowel sounds normal.  Peripheral Vascular Upper Extremity Palpation - Pulses bilaterally  normal.  Neurologic Neurologic evaluation reveals -alert and oriented x 3 with no impairment of recent or remote memory. Mental Status-Normal.  Neuropsychiatric The patient's mood and affect are described as -normal. Judgment and Insight-insight is appropriate concerning matters relevant to self.  Musculoskeletal Normal Exam - Left-Upper Extremity Strength Normal and Lower Extremity Strength Normal. Normal Exam - Right-Upper Extremity Strength Normal and Lower Extremity Strength Normal.  Lymphatic Head & Neck  General Head & Neck Lymphatics: Bilateral - Description - Normal. Axillary - Did not examine. Femoral & Inguinal - Did not examine.    Assessment & Plan Carrie Areola(July Linam M. Shenell Rogalski MD; 06/15/2018 12:14 PM)  MORBID OBESITY (E66.01) Story: We rediscussed weight loss surgery in the typical hospitalization and postoperative course. She was given her risk and outcome profile [facility] the [facility] calculator Impression: The patient meets weight loss surgery criteria. I think the patient would be an acceptable candidate for Laparoscopic vertical sleeve gastrectomy.  We rediscussed LSG. We discussed the preoperative, operative and postoperative process. Using diagrams, I explained the surgery in detail including the performance of an EGD near the end of the surgery. We discussed the typical hospital course including a 2-3 day stay baring any complications. The patient was given educational material. I quoted the patient that most patients can lose up to 50-55% of their excess weight. We did discuss the possibility of weight regain several years after the procedure.  The risks of infection, bleeding, pain, scarring, weight regain, too little or too much weight loss, vitamin deficiencies and need for lifelong vitamin supplementation, hair loss, need for protein supplementation, leaks, stricture, reflux, food intolerance, gallstone formation, hernia, need for reoperation, need for  open surgery, injury to spleen or surrounding structures, DVT's, PE, and death again were rediscussed with the patient and the patient expressed understanding and desires to proceed with laparoscopic vertical sleeve gastrectomy, possible open, intraoperative endoscopy.  We discussed that before and after surgery that there would be an alteration in their diet. I explained that we have put them on a diet 2 weeks before surgery. I also explained that they would be on a liquid diet for 2 weeks after surgery. We discussed that they would have to avoid certain foods after  surgery. We discussed the importance of physical activity as well as compliance with our dietary and supplement recommendations and routine follow-up.  Current Plans Pt Education - EMW_preopbariatric  SYRINGOMYELIA (G95.0)   HISTORY OF IBS (Z87.19)   PROTHROMBIN GENE MUTATION (Z30.86) Impression: heterozygous for prothrombin gene mutation Factor II   HISTORY OF PULMONARY EMBOLISM (Z86.711) Impression: We did discuss because she has had a history of pulmonary emboli in reportedly has a history of factor II mutation that she would be at increased risk for recurrent DVT or pulmonary emboli with weight loss surgery. we recieved clearance from her hematologist but did recommend extended lovenox for 1 month postop (1mg /kg bid x 1 mo). Dr Gery Pray.   HEADACHE, VARIANT MIGRAINE (G43.809)   HYPERTENSION, ESSENTIAL (I10)   CHRONIC MIDLINE LOW BACK PAIN WITH LEFT-SIDED SCIATICA (M54.42)   HEARTBURN (R12) Impression: We did discuss that sleeve gastrectomy may worsen her heartburn or reflux.  Mary Sella. Carrie Campanile, MD, FACS General, Bariatric, & Minimally Invasive Surgery [facility], [place]

## 2018-06-21 ENCOUNTER — Inpatient Hospital Stay (HOSPITAL_COMMUNITY)
Admission: RE | Admit: 2018-06-21 | Payer: BLUE CROSS/BLUE SHIELD | Source: Other Acute Inpatient Hospital | Admitting: General Surgery

## 2018-06-21 ENCOUNTER — Encounter (HOSPITAL_COMMUNITY): Admission: RE | Payer: Self-pay | Source: Other Acute Inpatient Hospital

## 2018-06-21 SURGERY — GASTRECTOMY, SLEEVE, LAPAROSCOPIC
Anesthesia: General

## 2018-07-04 ENCOUNTER — Ambulatory Visit: Payer: Self-pay

## 2018-07-27 IMAGING — XA IR INFUSION THROMBOL ARTERIAL INITIAL (MS)
1 series · 1 of 1 positions shown · non-contrast
Comparison: Chest CTA - 02/05/2014

INDICATION: Sub massive pulmonary emboli with right heart strain, elevated
troponins, shortness of breath.

EXAM:
1. ULTRASOUND GUIDANCE FOR VENOUS ACCESS X2
2. FLUOROSCOPIC GUIDED PLACEMENT OF BILATERAL PULMONARY ARTERIAL
LYTIC INFUSION CATHETERS
TECHNIQUE: Informed written consent was obtained from the patient after a
discussion of the risks, benefits and alternatives to treatment.
Questions regarding the procedure were encouraged and answered. A
timeout was performed prior to the initiation of the procedure.

[Series 300: dsa body · 1 of 1 slices shown]
[im 1/1]
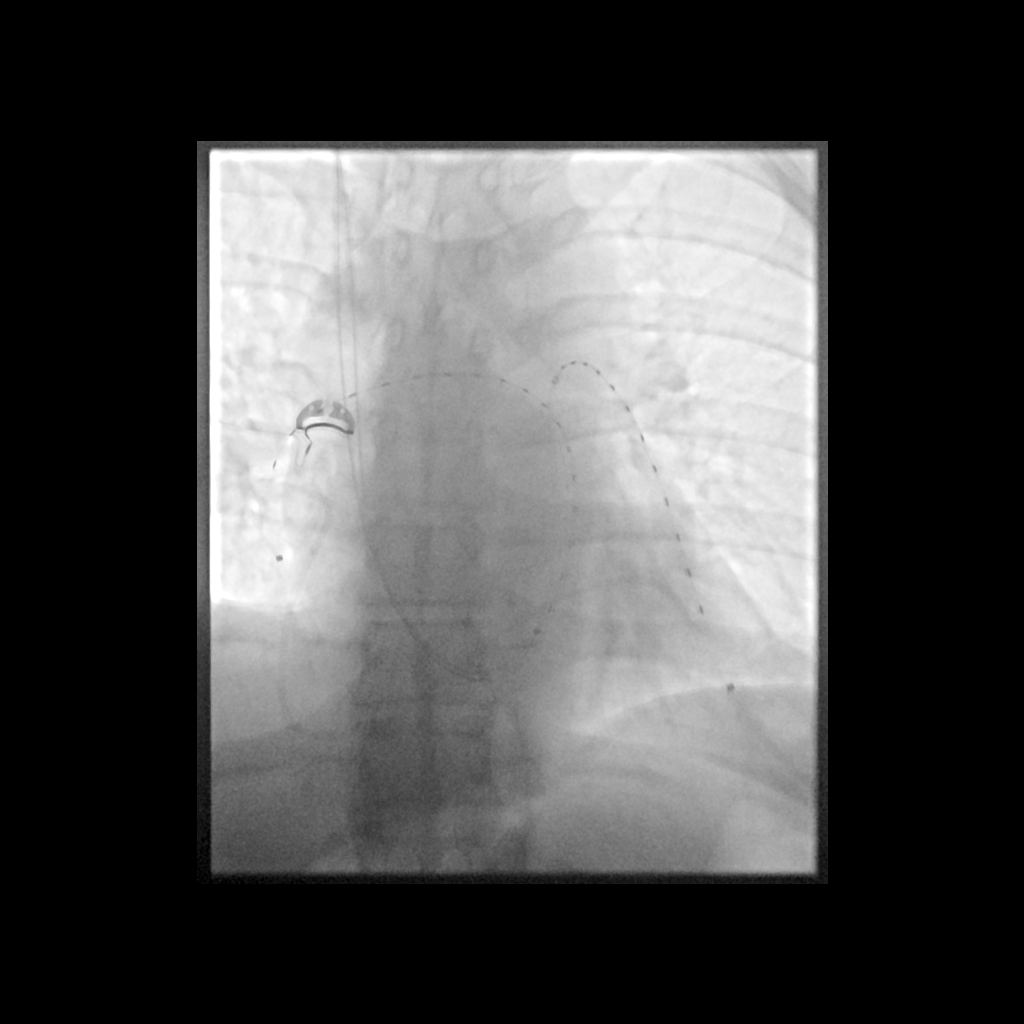

[1 of 1 positions shown; findings below may reference images not displayed]

MEDICATIONS:
Versed 3 mg IV; Fentanyl 100 mcg IV

Sedation time: 60 minutes

FLUOROSCOPY TIME:  10 minutes 54 seconds.

COMPLICATIONS:
None immediate
Because of known lower extremity DVT, jugular approach was selected
for vascular access.

Patency of the right IJ vein confirmed with ultrasound.

The right neck was prepped and draped in the usual sterile fashion,
and a sterile drape was applied covering the operative field.
Maximum barrier sterile technique with sterile gowns and gloves were
used for the procedure. A timeout was performed prior to the
initiation of the procedure. Local anesthesia was provided with 1%
lidocaine.

Under direct ultrasound guidance, the right internal jugular vein
was accessed with a micro puncture transitional dilator, dilated
with a 6 French vascular sheath. With the use of a glidewire, an
angled pigtail catheter was advanced into the right main pulmonary
artery . Pressure measurements were then obtained from the right
pulmonary artery. Catheter exchanged for the vascular sheath through
which a 105/18 cm multi side-hole EKOS ultrasound assisted infusion
catheter was positioned extending into lower lobe branch of the
right pulmonary artery.

Slightly caudal to this initial access, the right internal jugular
vein was again accessed with a micropuncture set, ultimately
allowing placement of a 7 French angled pigtail catheter, advanced
into the left pulmonary artery, exchanged for the 6 French vascular
sheath through which a 105/12 cm multi side-hole EKOS ultrasound
assisted infusion catheter was positioned extending into lower lobe
branch of the right pulmonary artery.

A postprocedural fluoroscopic image was obtained of the check
demonstrating final catheter positioning.

Both vascular sheaths were secured at the right neck with 0 silk
sutures. The external catheter tubing was secured at the right chest
and the lytic therapy was initiated.

The patient tolerated the procedure well without immediate
postprocedural complication.
FINDINGS: Elevated pulmonary arterial pressure,

Right main pulmonary artery - 40/23  mean [id number] (normal: < [DATE]).

Following the procedure, both ultrasound assisted infusion catheter
tips terminate within the distal aspects of the bilateral lower lobe
sub segmental pulmonary arteries. Ultrasound assisted catheter
directed bilateral pulmonary arterial thrombolysis was initiated.
IMPRESSION: 1. Successful fluoroscopic guided initiation of bilateral ultrasound
assisted catheter directed pulmonary arterial lysis for sub massive
pulmonary embolism and right-sided heart strain.
2. Markedly elevated pressure measurements within the right main
pulmonary artery compatible with critical pulmonary arterial
hypertension.

PLAN:
Overnight ICU observation with follow-up pressure measurements in
the morning.

## 2018-07-28 IMAGING — CR DG CHEST 1V PORT
1 series · 1 of 1 positions shown · non-contrast
Comparison: 06/14/2016

CLINICAL DATA: Known pulmonary embolism with shortness of Breath

EXAM:
PORTABLE CHEST 1 VIEW

[AP]
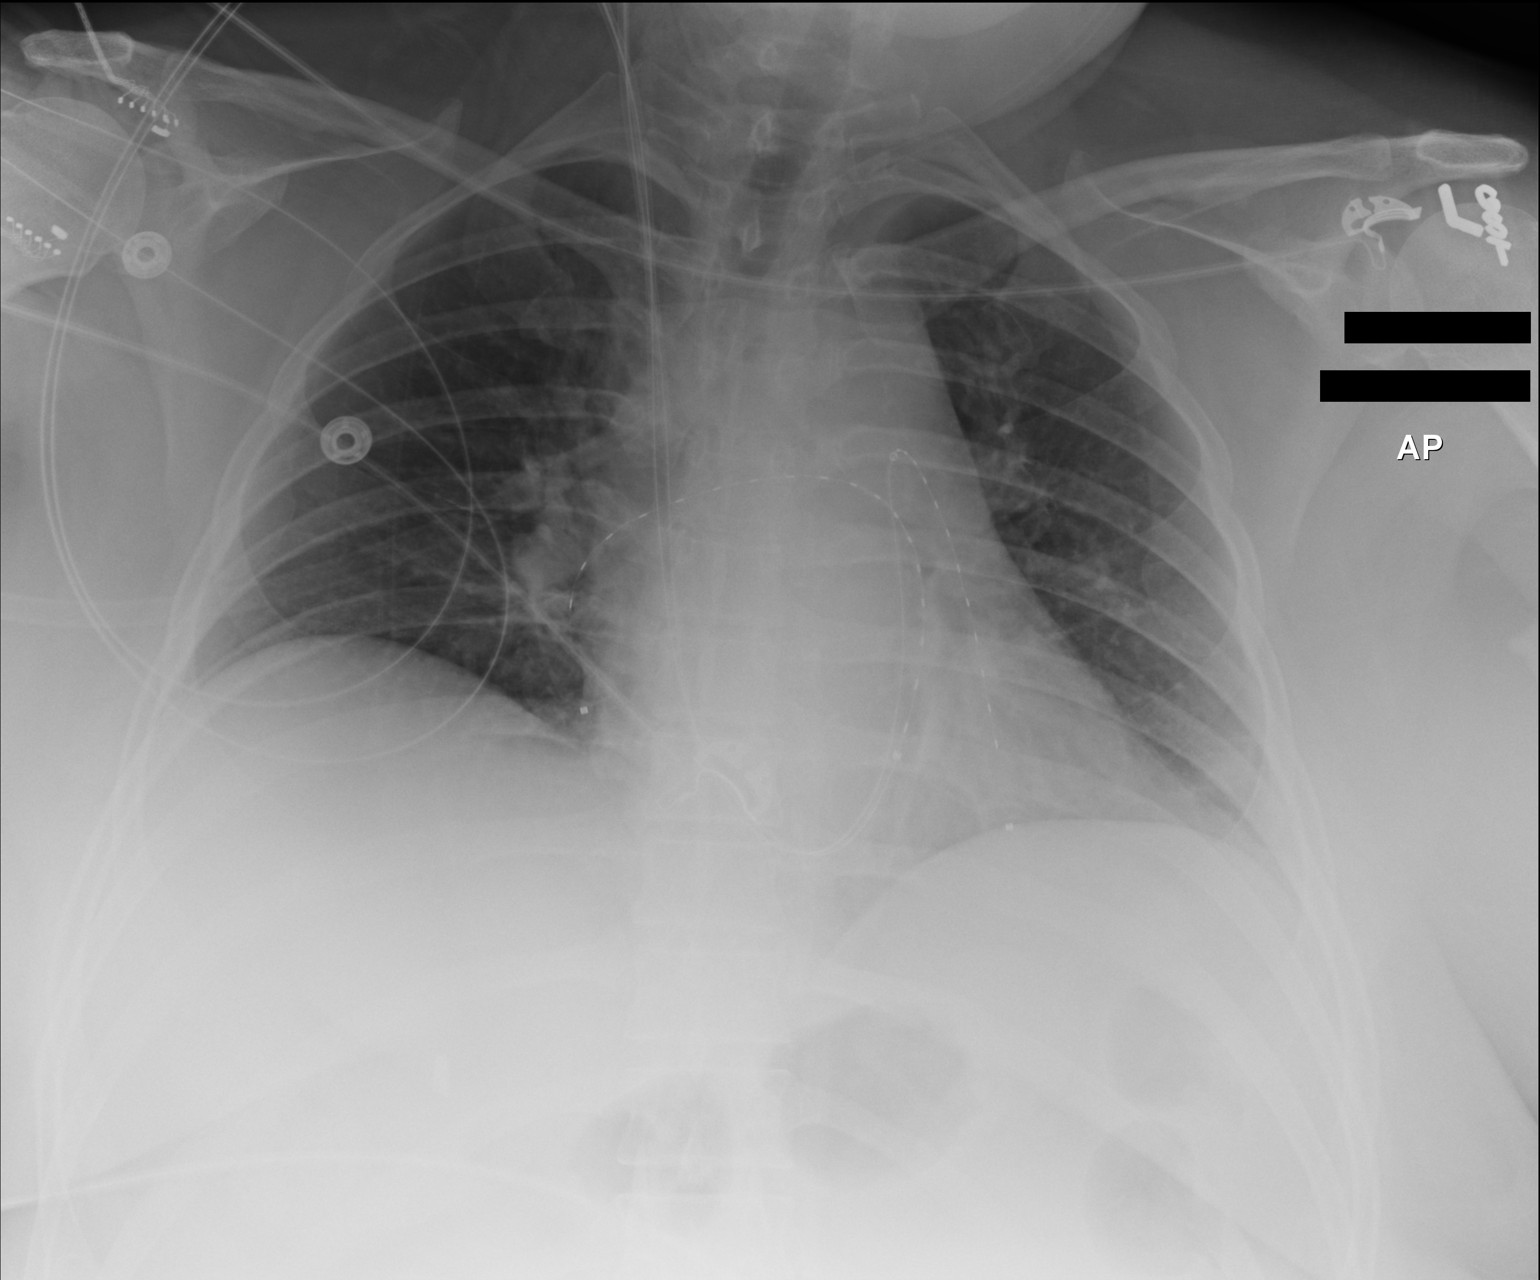

[1 of 1 positions shown; findings below may reference images not displayed]

FINDINGS: Cardiac shadow is mildly prominent but stable from the prior exam.
Two right jugular infusion catheters are now seen extending into the
pulmonary arteries bilaterally. The lungs are well aerated
bilaterally. No bony abnormality is seen.
IMPRESSION: No acute abnormality noted.

EKOS infusion catheters are noted bilaterally.

## 2018-09-05 DIAGNOSIS — H35723 Serous detachment of retinal pigment epithelium, bilateral: Secondary | ICD-10-CM | POA: Insufficient documentation

## 2018-09-05 DIAGNOSIS — H532 Diplopia: Secondary | ICD-10-CM | POA: Insufficient documentation

## 2018-09-14 DIAGNOSIS — F32A Depression, unspecified: Secondary | ICD-10-CM | POA: Insufficient documentation

## 2018-09-14 DIAGNOSIS — F5081 Binge eating disorder: Secondary | ICD-10-CM | POA: Insufficient documentation

## 2018-09-14 DIAGNOSIS — F329 Major depressive disorder, single episode, unspecified: Secondary | ICD-10-CM | POA: Insufficient documentation

## 2018-09-14 DIAGNOSIS — D6859 Other primary thrombophilia: Secondary | ICD-10-CM | POA: Insufficient documentation

## 2018-09-14 DIAGNOSIS — F50819 Binge eating disorder, unspecified: Secondary | ICD-10-CM | POA: Insufficient documentation

## 2018-09-14 DIAGNOSIS — J45909 Unspecified asthma, uncomplicated: Secondary | ICD-10-CM | POA: Insufficient documentation

## 2018-09-14 DIAGNOSIS — I10 Essential (primary) hypertension: Secondary | ICD-10-CM | POA: Insufficient documentation

## 2018-09-14 DIAGNOSIS — K5909 Other constipation: Secondary | ICD-10-CM | POA: Insufficient documentation

## 2018-09-14 DIAGNOSIS — D6851 Activated protein C resistance: Secondary | ICD-10-CM | POA: Insufficient documentation

## 2018-09-14 DIAGNOSIS — K219 Gastro-esophageal reflux disease without esophagitis: Secondary | ICD-10-CM | POA: Insufficient documentation

## 2018-09-14 DIAGNOSIS — Z86711 Personal history of pulmonary embolism: Secondary | ICD-10-CM | POA: Insufficient documentation

## 2018-09-28 DIAGNOSIS — G4733 Obstructive sleep apnea (adult) (pediatric): Secondary | ICD-10-CM | POA: Insufficient documentation

## 2019-01-14 DIAGNOSIS — E669 Obesity, unspecified: Secondary | ICD-10-CM

## 2019-01-14 DIAGNOSIS — R079 Chest pain, unspecified: Secondary | ICD-10-CM | POA: Diagnosis not present

## 2019-01-14 DIAGNOSIS — F319 Bipolar disorder, unspecified: Secondary | ICD-10-CM

## 2019-01-14 DIAGNOSIS — I10 Essential (primary) hypertension: Secondary | ICD-10-CM | POA: Diagnosis not present

## 2019-01-14 DIAGNOSIS — G4733 Obstructive sleep apnea (adult) (pediatric): Secondary | ICD-10-CM | POA: Diagnosis not present

## 2019-01-14 DIAGNOSIS — R55 Syncope and collapse: Secondary | ICD-10-CM | POA: Diagnosis not present

## 2019-01-15 DIAGNOSIS — R079 Chest pain, unspecified: Secondary | ICD-10-CM | POA: Diagnosis not present

## 2019-01-15 DIAGNOSIS — Z86711 Personal history of pulmonary embolism: Secondary | ICD-10-CM

## 2019-01-15 DIAGNOSIS — I10 Essential (primary) hypertension: Secondary | ICD-10-CM | POA: Diagnosis not present

## 2019-01-15 DIAGNOSIS — R55 Syncope and collapse: Secondary | ICD-10-CM | POA: Diagnosis not present

## 2019-01-15 DIAGNOSIS — G4733 Obstructive sleep apnea (adult) (pediatric): Secondary | ICD-10-CM | POA: Diagnosis not present

## 2019-01-16 DIAGNOSIS — I10 Essential (primary) hypertension: Secondary | ICD-10-CM | POA: Diagnosis not present

## 2019-01-16 DIAGNOSIS — Z86711 Personal history of pulmonary embolism: Secondary | ICD-10-CM | POA: Diagnosis not present

## 2019-01-16 DIAGNOSIS — G4733 Obstructive sleep apnea (adult) (pediatric): Secondary | ICD-10-CM | POA: Diagnosis not present

## 2019-01-16 DIAGNOSIS — R55 Syncope and collapse: Secondary | ICD-10-CM | POA: Diagnosis not present

## 2019-01-16 DIAGNOSIS — R079 Chest pain, unspecified: Secondary | ICD-10-CM | POA: Diagnosis not present

## 2019-02-01 ENCOUNTER — Ambulatory Visit: Payer: BC Managed Care – PPO | Admitting: Cardiology

## 2019-02-24 ENCOUNTER — Telehealth: Payer: Self-pay

## 2019-02-24 ENCOUNTER — Other Ambulatory Visit: Payer: Self-pay

## 2019-02-24 ENCOUNTER — Ambulatory Visit (INDEPENDENT_AMBULATORY_CARE_PROVIDER_SITE_OTHER): Payer: BC Managed Care – PPO | Admitting: Cardiology

## 2019-02-24 ENCOUNTER — Encounter: Payer: Self-pay | Admitting: Cardiology

## 2019-02-24 VITALS — BP 102/76 | HR 96 | Ht 64.0 in | Wt 281.0 lb

## 2019-02-24 DIAGNOSIS — R079 Chest pain, unspecified: Secondary | ICD-10-CM | POA: Diagnosis not present

## 2019-02-24 DIAGNOSIS — I10 Essential (primary) hypertension: Secondary | ICD-10-CM | POA: Diagnosis not present

## 2019-02-24 DIAGNOSIS — G4733 Obstructive sleep apnea (adult) (pediatric): Secondary | ICD-10-CM

## 2019-02-24 DIAGNOSIS — G950 Syringomyelia and syringobulbia: Secondary | ICD-10-CM | POA: Diagnosis not present

## 2019-02-24 DIAGNOSIS — R55 Syncope and collapse: Secondary | ICD-10-CM | POA: Diagnosis not present

## 2019-02-24 DIAGNOSIS — R002 Palpitations: Secondary | ICD-10-CM

## 2019-02-24 DIAGNOSIS — Z86711 Personal history of pulmonary embolism: Secondary | ICD-10-CM

## 2019-02-24 NOTE — Addendum Note (Signed)
Addended by: Beckey Rutter on: 02/24/2019 02:13 PM   Modules accepted: Orders

## 2019-02-24 NOTE — Progress Notes (Addendum)
Cardiology Office Note:    Date:  02/24/2019   ID:  Carrie Joyce, DOB 02/05/1971, MRN [id number]  PCP:  [facility]  Cardiologist:  Garwin Brothersajan R Darlene Brozowski, MD   Referring MD: [facility]    ASSESSMENT:    1. Syncope and collapse   2. Essential hypertension   3. Obstructive sleep apnea (adult) (pediatric)   4. Syringomyelia and syringobulbia (HCC)   5. History of pulmonary embolus (PE)    PLAN:    In order of problems listed above:  1. Primary prevention stressed with the patient.  Importance of compliance with diet and medication stressed and she vocalized understanding. 2. Essential hypertension: Blood pressure stable 3. Syncope: Patient has been evaluated extensively.  Echocardiogram is unremarkable and she has been advised an event monitor and I agree with this.  I will do a 1 month event monitor. 4. She has symptoms of chest pain and these are atypical however in view of multiple risk factors we will do a Lexiscan sestamibi for objective evidence of ischemia. 5. Morbid obesity: Diet was discussed.  Importance of compliance stressed and she vocalized understanding and she sees a specialist for this. 6. Patient will be seen in follow-up appointment in 6 months or earlier if the patient has any concerns  Stress test was unremarkable and in view of this she is not at high risk for coronary events during the aforementioned surgery.  Meticulous hemodynamic monitoring will further reduce the risk of coronary events. Signed Dr. Glean Hessajan Truth Barot 05/12/2019    Medication Adjustments/Labs and Tests Ordered: Current medicines are reviewed at length with the patient today.  Concerns regarding medicines are outlined above.  No orders of the defined types were placed in this encounter.  No orders of the defined types were placed in this encounter.    History of Present Illness:    Carrie Joyce is a 48 y.o. female who is being seen today  for the evaluation of syncope at the request of [facility]  Patient is a pleasant 48 year old female.  She has past medical history of skin abnormality.  She has history of essential hypertension.  She has had multiple syncopal episodes for which she has been evaluated in the past.  She also complains of some chest discomfort.  She has had multiple pulmonary embolism episodes in the past according to the history provided by the patient.  She is not on anticoagulation per the recommendations by her specialist.  She is here for evaluation of chest pain which is episodic and a knifelike sensation.  It is not related to exertion.  She leads a sedentary lifestyle because of her neurological issues and also because of gait instability.  Her neurologist has advised her always to walk with a walker.  At the time of my evaluation, the patient is alert awake oriented and in no distress.  She also is aware that she is not supposed to be driving as informed by her physicians due to history of syncope.  Past Medical History:  Diagnosis Date  . ACL tear 2015  . Anxiety   . Asthma   . Blood dyscrasia   . Cervical spinal cord injury (HCC)   . COPD (chronic obstructive pulmonary disease) (HCC)   . Depression   . Dyspnea   . Essential hypertension   . Factor V Leiden (HCC)   . Factor V Leiden, prothrombin gene mutation (HCC)   . Falls frequently   . Fibromyalgia   .  Headache   . Insomnia   . Interstitial cystitis   . Low back pain   . Migraines   . Multiple thyroid nodules   . Myalgia   . Numbness and tingling of foot   . Pulmonary emboli (HCC)    felt secondary to estrogen for dysfunctional uterine bleeding and clotting disorder - followed by Hematology  . Secondary right ventricular dilation 10/05/2016  . Spondylosis of lumbar region without myelopathy or radiculopathy 05/2018  . Syringomyelia (HCC)   . Syrinx of spinal cord (HCC) 09/2016  . Tear of MCL (medial collateral ligament) of  knee 2015    Past Surgical History:  Procedure Laterality Date  . CHOLECYSTECTOMY    . IR GENERIC HISTORICAL  06/15/2016   IR ANGIOGRAM SELECTIVE EACH ADDITIONAL VESSEL 06/15/2016 Oley Balmaniel Hassell, MD [facility]  . IR GENERIC HISTORICAL  06/15/2016   IR US GUIDE VASC ACCESS RIGHT 06/15/2016 Oley Balmaniel Hassell, MD [facility]  . IR GENERIC HISTORICAL  06/15/2016   IR ANGIOGRAM SELECTIVE EACH ADDITIONAL VESSEL 06/15/2016 Oley Balmaniel Hassell, MD [facility]  . IR GENERIC HISTORICAL  06/15/2016   IR ANGIOGRAM PULMONARY BILATERAL SELECTIVE 06/15/2016 Oley Balmaniel Hassell, MD [facility]  . IR GENERIC HISTORICAL  06/15/2016   IR INFUSION THROMBOL ARTERIAL INITIAL (MS) 06/15/2016 Oley Balmaniel Hassell, MD [facility]  . IR GENERIC HISTORICAL  06/15/2016   IR INFUSION THROMBOL ARTERIAL INITIAL (MS) 06/15/2016 Oley Balmaniel Hassell, MD [facility]  . IR GENERIC HISTORICAL  06/16/2016   IR THROMB F/U EVAL ART/VEN FINAL DAY (MS) 06/16/2016 Oley Balmaniel Hassell, MD [facility]    Current Medications: No outpatient medications have been marked as taking for the 02/24/19 encounter (Office Visit) with Karsin Pesta, Aundra Dubinajan R, MD.     Allergies:   Ciprofloxacin, Sulfa antibiotics, Codeine, Penicillins, and Prochlorperazine   Social History   Socioeconomic History  . Marital status: Married    Spouse name: Dannielle HuhDanny  . Number of children: 1  . Years of education: College  . Highest education level: Not on file  Occupational History  . 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 Financial resource strain: Not on file  . Food insecurity    Worry: Not on file    Inability: Not on file  . Transportation needs    Medical: Not on file    Non-medical: Not on file  Tobacco Use  . Smoking status: Never Smoker  . Smokeless tobacco: Never Used  Substance and Sexual Activity  . Alcohol use: Yes    Alcohol/week: 0.0 standard drinks    Comment: Occasional  . Drug use: No  . Sexual activity: Yes    Birth control/protection: None  Lifestyle  .  Physical activity    Days per week: Not on file    Minutes per session: Not on file  . Stress: Not on file  Relationships  . Social Musicianconnections    Talks on phone: Not on file    Gets together: Not on file    Attends religious service: Not on file    Active member of club or organization: Not on file    Attends meetings of clubs or organizations: Not on file    Relationship status: Not on file  Other Topics Concern  . Not on file  Social History Narrative   Patient is married.   Patient is left handed   Patient does not drink caffeine   Patient has college degree     Family History: The patient's family history includes Fibromyalgia in her mother; Hypertension  in her mother; SIDS in her brother.  ROS:   Please see the history of present illness.    All other systems reviewed and are negative.  EKGs/Labs/Other Studies Reviewed:    The following studies were reviewed today: EKG reveals sinus rhythm and nonspecific ST-T changes   Recent Labs: 06/14/2018: BUN 12; Creatinine, Ser 0.98; Hemoglobin 14.6; Platelets 338; Potassium 4.0; Sodium 137  Recent Lipid Panel No results found for: CHOL, TRIG, HDL, CHOLHDL, VLDL, LDLCALC, LDLDIRECT  Physical Exam:    VS:  BP 102/76 (BP Location: Right Arm, Patient Position: Sitting)   Pulse 96   Ht 5\' 4"  (1.626 m)   Wt 281 lb (127.5 kg)   SpO2 100%   BMI 48.23 kg/m     Wt Readings from Last 3 Encounters:  02/24/19 281 lb (127.5 kg)  06/14/18 275 lb 6.4 oz (124.9 kg)  06/06/18 280 lb (127 kg)     GEN: Patient is in no acute distress HEENT: Normal NECK: No JVD; No carotid bruits LYMPHATICS: No lymphadenopathy CARDIAC: S1 S2 regular, 2/6 systolic murmur at the apex. RESPIRATORY:  Clear to auscultation without rales, wheezing or rhonchi  ABDOMEN: Soft, non-tender, non-distended MUSCULOSKELETAL:  No edema; No deformity  SKIN: Warm and dry NEUROLOGIC:  Alert and oriented x 3 PSYCHIATRIC:  Normal affect    Signed, Jenean Lindau, MD  02/24/2019 1:55 PM    [facility]

## 2019-02-24 NOTE — Patient Instructions (Signed)
Medication Instructions:  Your physician recommends that you continue on your current medications as directed. Please refer to the Current Medication list given to you today.  If you need a refill on your cardiac medications before your next appointment, please call your pharmacy.   Lab work: NONE If you have labs (blood work) drawn today and your tests are completely normal, you will receive your results only by: Marland Kitchen MyChart Message (if you have MyChart) OR . A paper copy in the mail If you have any lab test that is abnormal or we need to change your treatment, we will call you to review the results.  Testing/Procedures: You had an EKG performed today.  Your physician has recommended that you wear an event monitor. Event monitors are medical devices that record the heart's electrical activity. Doctors most often [place] these monitors to diagnose arrhythmias. Arrhythmias are problems with the speed or rhythm of the heartbeat. The monitor is a small, portable device. You can wear one while you do your normal daily activities. This is usually used to diagnose what is causing palpitations/syncope (passing out).    Your physician has requested that you have a 2 day lexiscan myoview. This test will be performed at [facility]. You will be contacted to schedule it. For further information please visit [email or web address]. Please follow instruction sheet, as given.   Follow-Up: At [facility], you and your health needs are our priority.  As part of our continuing mission to provide you with exceptional heart care, we have created designated Provider Care Teams.  These Care Teams include your primary Cardiologist (physician) and Advanced Practice Providers (APPs -  Physician Assistants and Nurse Practitioners) who all work together to provide you with the care you need, when you need it. You will need a follow up appointment in 6 months.   Any Other Special Instructions Will Be Listed Below   Regadenoson injection What is this medicine? REGADENOSON is used to test the heart for coronary artery disease. It is used in patients who can not exercise for their stress test. This medicine may be used for other purposes; ask your health care provider or pharmacist if you have questions. COMMON BRAND NAME(S): Lexiscan What should I tell my health care provider before I take this medicine? They need to know if you have any of these conditions:  heart problems  lung or breathing disease, like asthma or COPD  an unusual or allergic reaction to regadenoson, other medicines, foods, dyes, or preservatives  pregnant or trying to get pregnant  breast-feeding How should I use this medicine? This medicine is for injection into a vein. It is given by a health care professional in a hospital or clinic setting. Talk to your pediatrician regarding the use of this medicine in children. Special care may be needed. Overdosage: If you think you have taken too much of this medicine contact a poison control center or emergency room at once. NOTE: This medicine is only for you. Do not share this medicine with others. What if I miss a dose? This does not apply. What may interact with this medicine?  caffeine  dipyridamole  guarana  theophylline This list may not describe all possible interactions. Give your health care provider a list of all the medicines, herbs, non-prescription drugs, or dietary supplements you use. Also tell them if you smoke, drink alcohol, or use illegal drugs. Some items may interact with your medicine. What should I watch for while using this medicine? Your condition will  be monitored carefully while you are receiving this medicine. Do not take medicines, foods, or drinks with caffeine (like coffee, tea, or colas) for at least 12 hours before your test. If you do not know if something contains caffeine, ask your health care professional. What side effects may I notice from  receiving this medicine? Side effects that you should report to your doctor or health care professional as soon as possible:  allergic reactions like skin rash, itching or hives, swelling of the face, lips, or tongue  breathing problems  chest pain, tightness or palpitations  severe headache Side effects that usually do not require medical attention (report to your doctor or health care professional if they continue or are bothersome):  flushing  headache  irritation or pain at site where injected  nausea, vomiting This list may not describe all possible side effects. Call your doctor for medical advice about side effects. You may report side effects to FDA at 1-800-FDA-1088. Where should I keep my medicine? This drug is given in a hospital or clinic and will not be stored at home. NOTE: This sheet is a summary. It may not cover all possible information. If you have questions about this medicine, talk to your doctor, pharmacist, or health care provider.  2020 Elsevier/Gold Standard (2008-02-27 15:08:13)  Cardiac Nuclear Scan A cardiac nuclear scan is a test that is done to check the flow of blood to your heart. It is done when you are resting and when you are exercising. The test looks for problems such as:  Not enough blood reaching a portion of the heart.  The heart muscle not working as it should. You may need this test if:  You have heart disease.  You have had lab results that are not normal.  You have had heart surgery or a balloon procedure to open up blocked arteries (angioplasty).  You have chest pain.  You have shortness of breath. In this test, a special dye (tracer) is put into your bloodstream. The tracer will travel to your heart. A camera will then take pictures of your heart to see how the tracer moves through your heart. This test is usually done at a hospital and takes 2-4 hours. Tell a doctor about:  Any allergies you have.  All medicines you are  taking, including vitamins, herbs, eye drops, creams, and over-the-counter medicines.  Any problems you or family members have had with anesthetic medicines.  Any blood disorders you have.  Any surgeries you have had.  Any medical conditions you have.  Whether you are pregnant or may be pregnant. What are the risks? Generally, this is a safe test. However, problems may occur, such as:  Serious chest pain and heart attack. This is only a risk if the stress portion of the test is done.  Rapid heartbeat.  A feeling of warmth in your chest. This feeling usually does not last long.  Allergic reaction to the tracer. What happens before the test?  Ask your doctor about changing or stopping your normal medicines. This is important.  Follow instructions from your doctor about what you cannot eat or drink.  Remove your jewelry on the day of the test. What happens during the test?  An IV tube will be inserted into one of your veins.  Your doctor will give you a small amount of tracer through the IV tube.  You will wait for 20-40 minutes while the tracer moves through your bloodstream.  Your heart will be monitored  with an electrocardiogram (ECG).  You will lie down on an exam table.  Pictures of your heart will be taken for about 15-20 minutes.  You may also have a stress test. For this test, one of these things may be done: ? You will be asked to exercise on a treadmill or a stationary bike. ? You will be given medicines that will make your heart work harder. This is done if you are unable to exercise.  When blood flow to your heart has peaked, a tracer will again be given through the IV tube.  After 20-40 minutes, you will get back on the exam table. More pictures will be taken of your heart.  Depending on the tracer that is used, more pictures may need to be taken 3-4 hours later.  Your IV tube will be removed when the test is over. The test may vary among doctors and  hospitals. What happens after the test?  Ask your doctor: ? Whether you can return to your normal schedule, including diet, activities, and medicines. ? Whether you should drink more fluids. This will help to remove the tracer from your body. Drink enough fluid to keep your pee (urine) pale yellow.  Ask your doctor, or the department that is doing the test: ? When will my results be ready? ? How will I get my results? Summary  A cardiac nuclear scan is a test that is done to check the flow of blood to your heart.  Tell your doctor whether you are pregnant or may be pregnant.  Before the test, ask your doctor about changing or stopping your normal medicines. This is important.  Ask your doctor whether you can return to your normal activities. You may be asked to drink more fluids. This information is not intended to replace advice given to you by your health care provider. Make sure you discuss any questions you have with your health care provider. Document Released: 12/13/2017 Document Revised: 10/19/2018 Document Reviewed: 12/13/2017 Elsevier Patient Education  2020 [employer]

## 2019-02-28 ENCOUNTER — Other Ambulatory Visit: Payer: Self-pay

## 2019-02-28 NOTE — Addendum Note (Signed)
Addended by: Beckey Rutter on: 02/28/2019 10:07 AM   Modules accepted: Orders

## 2019-03-02 NOTE — Telephone Encounter (Signed)
Lexi letter generated and mailed

## 2019-03-03 ENCOUNTER — Telehealth: Payer: Self-pay | Admitting: *Deleted

## 2019-03-03 NOTE — Telephone Encounter (Signed)
Left message for pt to call us back about monitor and to give her the appt time for her lexi: 9/22 and 9?23 at 11:15, instruction letter is in the mail.

## 2019-03-06 NOTE — Telephone Encounter (Signed)
Pl check with [place] nurse for any suggestions

## 2019-03-06 NOTE — Telephone Encounter (Signed)
Returned your call.

## 2019-03-06 NOTE — Telephone Encounter (Signed)
Spoke with pt and gave her appt time and date for lexi. She wanted [place] to know that the Zio the nurse put on her did not stay on for 1 day. Saturday kept falling off and she spoke with company who said she should keep cleaning area, pressing on for 5 min but this did not work. Pt states that company keeps calling her for her information but she says will not give it to them due to not paying for monitor because would not stick. Also stated anything that sticks like that will not stay on her so the preventice most likely would not work either. I informed pt Dr. Geraldo Pitter is out of town but she wants to know what we can do about her heart now, has had the blacking out spells and is worried. Please advise.

## 2019-03-07 NOTE — Telephone Encounter (Signed)
The only suggestion would be to purchase [employer]ive[employer] monitor, but this wouldn't be the best idea for monitoring syncope. She should try another monitor or be referred to discuss ILR implant

## 2019-03-08 NOTE — Telephone Encounter (Signed)
Please get some details from [place] and we can try another monitor that she is referring to

## 2019-03-29 ENCOUNTER — Telehealth: Payer: Self-pay | Admitting: *Deleted

## 2019-03-29 NOTE — Telephone Encounter (Signed)
Left message on voicemail per DPR in reference to upcoming appointment scheduled on 04/04/19 with detailed instructions given per Myocardial Perfusion Study Information Sheet for the test. LM to arrive 15 minutes early, and that it is imperative to arrive on time for appointment to keep from having the test rescheduled. If you need to cancel or reschedule your appointment, please call the office within 24 hours of your appointment. Failure to do so may result in a cancellation of your appointment, and a $50 no show fee. Phone number given for call back for any questions. Kaleen Rochette Jacqueline    

## 2019-04-04 ENCOUNTER — Ambulatory Visit: Payer: BC Managed Care – PPO

## 2019-04-05 ENCOUNTER — Ambulatory Visit: Payer: BC Managed Care – PPO

## 2019-05-02 ENCOUNTER — Ambulatory Visit: Payer: BC Managed Care – PPO | Admitting: Internal Medicine

## 2019-05-02 ENCOUNTER — Other Ambulatory Visit: Payer: Self-pay

## 2019-05-02 ENCOUNTER — Telehealth: Payer: Self-pay | Admitting: Internal Medicine

## 2019-05-02 ENCOUNTER — Encounter: Payer: Self-pay | Admitting: Internal Medicine

## 2019-05-02 VITALS — BP 122/80 | HR 89 | Temp 97.2°F | Ht 64.0 in | Wt 282.6 lb

## 2019-05-02 DIAGNOSIS — I5189 Other ill-defined heart diseases: Secondary | ICD-10-CM

## 2019-05-02 DIAGNOSIS — R0609 Other forms of dyspnea: Secondary | ICD-10-CM

## 2019-05-02 DIAGNOSIS — Z86711 Personal history of pulmonary embolism: Secondary | ICD-10-CM | POA: Diagnosis not present

## 2019-05-02 DIAGNOSIS — R0600 Dyspnea, unspecified: Secondary | ICD-10-CM

## 2019-05-02 DIAGNOSIS — Z01811 Encounter for preprocedural respiratory examination: Secondary | ICD-10-CM

## 2019-05-02 DIAGNOSIS — Z8709 Personal history of other diseases of the respiratory system: Secondary | ICD-10-CM

## 2019-05-02 DIAGNOSIS — Z87898 Personal history of other specified conditions: Secondary | ICD-10-CM

## 2019-05-02 LAB — BASIC METABOLIC PANEL
BUN: 8 mg/dL (ref 6–23)
CO2: 27 mEq/L (ref 19–32)
Calcium: 9.4 mg/dL (ref 8.4–10.5)
Chloride: 100 mEq/L (ref 96–112)
Creatinine, Ser: 0.9 mg/dL (ref 0.40–1.20)
GFR: 66.98 mL/min (ref >60.00)
Glucose, Bld: 132 mg/dL — ABNORMAL HIGH (ref 70–99)
Potassium: 3.9 mEq/L (ref 3.5–5.1)
Sodium: 136 mEq/L (ref 135–145)

## 2019-05-02 LAB — CBC WITH DIFFERENTIAL/PLATELET
Basophils Absolute: 0 10*3/uL (ref 0.0–0.1)
Basophils Relative: 0.5 % (ref 0.0–3.0)
Eosinophils Absolute: 0.3 10*3/uL (ref 0.0–0.7)
Eosinophils Relative: 3.2 % (ref 0.0–5.0)
HCT: 44.2 % (ref 36.0–46.0)
Hemoglobin: 14.8 g/dL (ref 12.0–15.0)
Lymphocytes Relative: 23.6 % (ref 12.0–46.0)
Lymphs Abs: 2.1 10*3/uL (ref 0.7–4.0)
MCHC: 33.4 g/dL (ref 30.0–36.0)
MCV: 89.1 fl (ref 78.0–100.0)
Monocytes Absolute: 0.5 10*3/uL (ref 0.1–1.0)
Monocytes Relative: 5.5 % (ref 3.0–12.0)
Neutro Abs: 5.9 10*3/uL (ref 1.4–7.7)
Neutrophils Relative %: 67.2 % (ref 43.0–77.0)
Platelets: 382 10*3/uL (ref 150.0–400.0)
RBC: 4.96 Mil/uL (ref 3.87–5.11)
RDW: 14.3 % (ref 11.5–15.5)
WBC: 8.8 10*3/uL (ref 4.0–10.5)

## 2019-05-02 LAB — HEPATIC FUNCTION PANEL
ALT: 32 U/L (ref 0–35)
AST: 24 U/L (ref 0–37)
Albumin: 4.4 g/dL (ref 3.5–5.2)
Alkaline Phosphatase: 102 U/L (ref 39–117)
Bilirubin, Direct: 0.1 mg/dL (ref 0.0–0.3)
Total Bilirubin: 0.5 mg/dL (ref 0.2–1.2)
Total Protein: 7.8 g/dL (ref 6.0–8.3)

## 2019-05-02 LAB — D-DIMER, QUANTITATIVE: D-Dimer, Quant: 0.26 mcg/mL FEU (ref <0.50)

## 2019-05-02 NOTE — Progress Notes (Signed)
IOV 11/13/2016  Chief Complaint  Patient presents with  . Pulmonary Consult    Pt referred by Dr. Fransico Him for SOB x 5 months. Pt states the SOB is with activity and with rest, pt also c/o chest discomfort when active - resolves with neb treatment. Pt c/o prod cough with clear mucus.     48 year old obese female. History is gained from review of the chart and talking to the patient. It appears 06/15/2016 she had bilateral pulmonary embolism. According to the patient this was diagnosed at [facility]. She was transferred to [facility] where she was seen by the critical care service. She was treated with local thrombolysis using EKOS catheter. After this she was treated with Xarelto and discharged home. She tells me that in January 2018 she had recurrent pulmonary embolism. Visualization of the CT chest report and the image shows a right lower lobe pulmonary embolism which was believed to be a recurrent acute as opposed to a 95-day-old PE. Since then she's under the care of hematologist Dr. Anabel Bene in [place] who has her on daily Lovenox. Since then she's not had a pulmonary embolism recurrence including a CT chest February 2018 that shows resolving pulmonary embolism. Nevertheless she has had dyspnea since January 2015 present on exertion relieved by rest. It is moderate to severe. She also tells me a few days ago she had a syncopal episode and was treated [facility]. She does not recollect a CT chest there was apparently pulmonary embolism was ruled out. I do not have access to these records but it appears he was discharged on 11/10/2016 based on limited visualization of the chart that I can get. CT chest November 2018 also showed some 2 mm lung nodules in the right side   Today in the office 185 feet walking desaturation test 3 laps on room at: She did not desaturate  Exhaled nitric oxide today in the office is normal. She denies any cough or wheezing   Chest x-ray  December 2017 noted: Shows presence of local thrombolysis catheter in the setting of pulmonary embolism lung fields otherwise clear     Results for MIKHALA, KENAN (MRN [id number]) as of 11/13/2016 10:53  Ref. Range 06/16/2016 13:02 06/17/2016 04:39 06/17/2016 10:34 06/18/2016 02:37 06/18/2016 09:29  Creatinine Latest Ref Range: 0.44 - 1.00 mg/dL  1.04 (H)  1.02 (H)    Results for MAIMUNA, LEAMAN (MRN [id number]) as of 11/13/2016 10:53  Ref. Range 06/16/2016 13:02 06/17/2016 04:39 06/17/2016 10:34 06/18/2016 02:37 06/18/2016 09:29  Hemoglobin Latest Ref Range: 12.0 - 15.0 g/dL 11.0 (L) 11.0 (L)  11.9 (L)    Echocardiogram report from 10/20/2016 reviewed and shows normal left ventricular ejection fraction with grade 1 diastolic dysfunction    Chief Complaint  Patient presents with  . Follow-up    dyspnea     Referring provider: Greig Right, MD  HPI: 48 yo female seen for pulmonary consult 11/13/16 for dyspnea . She was dx with PE in 06/2016 tx w/ EKOS , discharged on Xarelto. Recurrent PE in 07/2016 . Followed by Hematology  She has Syringomyelia followed at [facility] by Neurology.   TEST  CT chest 06/2016 +PE  CT chest 07/2016 New Acute PE -recurrent  CT chest 08/2016 resolving PE .  Walk test 11/2016 neg for desats  FENO 11/2016 nml  Echo 10/2016 nml EF , GR 1 DD .  11/10/16 [facility] CT chest neg for PE (Care Everywhere)   02/01/2017  Follow up: PE , Dyspnea  Pt returns for follow up for persistent dyspnea.  Pt has hx of PE dx in 06/2016 while on Megace/Estrogen  (for dysfunctional uterine bleeding). She was tx with EKOS and Xarelto . She had worsening dyspnea in Jan 2018 and CT chest showed new PE felt to be recurrent PE while on Xarelto (compliant) . She was changed to full dose Lovenox injections  And referred to Hematology.  She has been found to have Factor V Leiden . Megace and Estrogen were stopped.   She was treated with Lovenox for 6 months. Despite tx for PE she has remained sob with  minimal activity . She was set up for a VQ scan that was neg for PE /Chronic PE . - 11/27/2016   CPST done on 12/24/16 showed nml lung function with no evidence to support exercise induced asthma. Felt body habitus played a role in exercise intolerance . Chronotropic incompetence . Suggestive of mild circulatory limitation/pulmonary vascular disease   She follows with cardiology and follow up echo in 10/2016 showed mild LVH, gr 1 DD , no evidence of RV enlargement or strain.   Pt says she has not been active in last few years due to chronic pain, muscular weakness due to Syringomyelia .  Complains over last 1 month of ankle edema and intermittent leg /calf pain .  Previous DVT in right  leg per pt. She has been off full dose lovenox since end of May 2018.  She denies chest pain, orthopnea, fever , syncope, rash , or fever.      OV 05/02/2019  Subjective:  Patient ID: Carrie Joyce, female , DOB: 11-24-1970 , age 19 y.o. , MRN: [id number] , ADDRESS: [address] [place] [address]   05/02/2019 -   Chief Complaint  Patient presents with  . Follow-up     HPI Lulu Roop Peddie 48 y.o. -presents for follow-up.  Have not seen her since early 2018.  She is here specifically for a new complaint of preoperative respiratory evaluation.  He tells me that she is interested in having weight loss bariatric surgery but given her medical issues they want preoperative pulmonary clearance.  She has been having a new issue of recurrent syncope for the last 2 years.  She has had falls and minor bruises because of this.  Some of the recent near syncope.  Some of this is full-blown syncope.  Her neurologist that she sees considering a mildly has referred her to cardiologist Dr. Glean Hess who is now evaluating her.  She is not sure if right heart catheterization is planned.  She has a history of pulmonary embolism from late 2017 in early 2018 recurrent.  The records have been within a month of each other.   She tells me that her hematologist only treated her with 6 months of anticoagulation with Lovenox injections and then stopped because of the end of 6 months therapy.  She does not remember if a D-dimer was checked.  She has had no bleeding episodes.  Since then she has been with her pulmonary embolism.  In May 2018 she had a VQ scan and also CT scan between our facility and [facility] which did not show any recurrence of pulmonary embolism.  In CT scan ruled out pulmonary embolism and RV strain she was admitted to the hospital at [facility] and had another CT scan of the chest that ruled out pulmonary embolism and RV strain.  History of asthma: She  says this is stable and she is on Breo  Dyspnea on exertion: She has class III dyspnea on exertion.  She feels this is slowly progressed over time.  Relieved by rest.  There is no associated cough or wheezing.  There is no worsening edema.  In July 2020 CT scan ruled out pulmonary embolism.  She had an echocardiogram at this time.  I reviewed the report and shows diastolic dysfunction but normal RV function.  She has a history of sleep apnea: She is not using the CPAP because of poor intolerance.  Previous chemistries do not show any retention of carbon dioxide.  Review of the records indicate the last blood work was in 2019 December although it with primary care physician she had in June 2020.  She is willing to have another set of labs done here.  I was not able to visualize the CT scans done [place] with visualized CT scans done within our health system  ROS - per HPI     has a past medical history of ACL tear (2015), Anxiety, Asthma, Blood dyscrasia, Cervical spinal cord injury (HCC), COPD (chronic obstructive pulmonary disease) (HCC), Depression, Dyspnea, Essential hypertension, Factor V Leiden (HCC), Factor V Leiden, prothrombin gene mutation (HCC), Falls frequently, Fibromyalgia, Headache, Insomnia, Interstitial cystitis, Low back pain,  Migraines, Multiple thyroid nodules, Myalgia, Numbness and tingling of foot, Pulmonary emboli (HCC), Secondary right ventricular dilation (10/05/2016), Spondylosis of lumbar region without myelopathy or radiculopathy (05/2018), Syringomyelia (HCC), Syrinx of spinal cord (HCC) (09/2016), and Tear of MCL (medial collateral ligament) of knee (2015).   reports that she has never smoked. She has never used smokeless tobacco.  Past Surgical History:  Procedure Laterality Date  . CHOLECYSTECTOMY    . IR GENERIC HISTORICAL  06/15/2016   IR ANGIOGRAM SELECTIVE EACH ADDITIONAL VESSEL 06/15/2016 Oley Balmaniel Hassell, MD [facility]  . IR GENERIC HISTORICAL  06/15/2016   IR US GUIDE VASC ACCESS RIGHT 06/15/2016 Oley Balmaniel Hassell, MD [facility]  . IR GENERIC HISTORICAL  06/15/2016   IR ANGIOGRAM SELECTIVE EACH ADDITIONAL VESSEL 06/15/2016 Oley Balmaniel Hassell, MD [facility]  . IR GENERIC HISTORICAL  06/15/2016   IR ANGIOGRAM PULMONARY BILATERAL SELECTIVE 06/15/2016 Oley Balmaniel Hassell, MD [facility]  . IR GENERIC HISTORICAL  06/15/2016   IR INFUSION THROMBOL ARTERIAL INITIAL (MS) 06/15/2016 Oley Balmaniel Hassell, MD [facility]  . IR GENERIC HISTORICAL  06/15/2016   IR INFUSION THROMBOL ARTERIAL INITIAL (MS) 06/15/2016 Oley Balmaniel Hassell, MD [facility]  . IR GENERIC HISTORICAL  06/16/2016   IR THROMB F/U EVAL ART/VEN FINAL DAY (MS) 06/16/2016 Oley Balmaniel Hassell, MD [facility]    Allergies  Allergen Reactions  . Ciprofloxacin Nausea And Vomiting and Hives  . Sulfa Antibiotics Hives and Swelling  . Codeine Nausea And Vomiting and Nausea Only  . Penicillins Hives and Other (See Comments)    Has patient had a PCN reaction causing immediate rash, facial/tongue/throat swelling, SOB or lightheadedness with hypotension: No Has patient had a PCN reaction causing severe rash involving mucus membranes or skin necrosis: No Has patient had a PCN reaction that required hospitalization: Yes Has patient had a PCN reaction occurring within the  last 10 years: No If all of the above answers are "NO", then may proceed with Cephalosporin use.   Marland Kitchen. Prochlorperazine Itching     There is no immunization history on file for this patient.  Family History  Problem Relation Age of Onset  . Hypertension Mother   . Fibromyalgia Mother   .  SIDS Brother      Current Outpatient Medications:  .  albuterol (PROVENTIL HFA;VENTOLIN HFA) 108 (90 Base) MCG/ACT inhaler, Inhale 2 puffs into the lungs every 6 (six) hours as needed for wheezing or shortness of breath., Disp: , Rfl:  .  BREO ELLIPTA 200-25 MCG/INH AEPB, , Disp: , Rfl:  .  cyclobenzaprine (FLEXERIL) 10 MG tablet, Take 10 mg by mouth 2 (two) times daily as needed for muscle spasms. , Disp: , Rfl:  .  DULoxetine (CYMBALTA) 60 MG capsule, Take 60 mg by mouth daily. , Disp: , Rfl: 1 .  famotidine (PEPCID) 40 MG tablet, Take 40 mg by mouth daily., Disp: , Rfl:  .  Fluticasone-Umeclidin-Vilant 100-62.5-25 MCG/INH AEPB, Inhale 1 puff into the lungs daily. , Disp: , Rfl:  .  gabapentin (NEURONTIN) 800 MG tablet, , Disp: , Rfl:  .  hydrOXYzine (ATARAX/VISTARIL) 25 MG tablet, Take 25 mg by mouth 3 (three) times daily as needed for anxiety. , Disp: , Rfl:  .  linaclotide (LINZESS) 145 MCG CAPS capsule, Take 145 mcg by mouth daily as needed (for constipation). , Disp: , Rfl:  .  Menthol, Topical Analgesic, (BIOFREEZE EX), Apply 1 application topically daily as needed (for pain). , Disp: , Rfl:  .  metoprolol succinate (TOPROL-XL) 25 MG 24 hr tablet, Take 25 mg by mouth daily., Disp: , Rfl:  .  Multiple Vitamin (MULTIVITAMIN WITH MINERALS) TABS tablet, Take 1 tablet by mouth daily., Disp: , Rfl:  .  omeprazole (PRILOSEC) 40 MG capsule, Take 40 mg by mouth 2 (two) times daily., Disp: , Rfl:  .  ondansetron (ZOFRAN-ODT) 4 MG disintegrating tablet, Take 4 mg by mouth every 8 (eight) hours as needed for nausea or vomiting., Disp: , Rfl:  .  promethazine (PHENERGAN) 25 MG tablet, Take 25 mg by mouth  every 6 (six) hours as needed for nausea or vomiting., Disp: , Rfl:  .  rizatriptan (MAXALT) 10 MG tablet, Take 10 mg by mouth as needed for migraine. May repeat in 2 hours if needed, Disp: , Rfl:  .  traZODone (DESYREL) 50 MG tablet, Take 50 mg by mouth at bedtime., Disp: , Rfl:  .  VITAMIN D PO, Take 1-2 tablets by mouth daily., Disp: , Rfl:  .  Galcanezumab-gnlm 120 MG/ML SOAJ, Inject into the skin., Disp: , Rfl:  .  hydrochlorothiazide (HYDRODIURIL) 25 MG tablet, Take 25 mg by mouth daily., Disp: , Rfl:  .  montelukast (SINGULAIR) 10 MG tablet, Take 10 mg by mouth at bedtime., Disp: , Rfl:  .  topiramate (TOPAMAX) 50 MG tablet, Take 50 mg by mouth at bedtime. , Disp: , Rfl:       Objective:   Vitals:   05/02/19 1055  BP: 122/80  Pulse: 89  Temp: (!) 97.2 F (36.2 C)  TempSrc: Oral  SpO2: 99%  Weight: 282 lb 9.6 oz (128.2 kg)  Height:  (1.626 m)    Estimated body mass index is 48.51 kg/m as calculated from the following:   Height as of this encounter:  (1.626 m).   Weight as of this encounter: 282 lb 9.6 oz (128.2 kg).  @  [employer]   05/02/19 1055  Weight: 282 lb 9.6 oz (128.2 kg)     Physical Exam  General Appearance:    Alert, cooperative, no distress, appears stated age - yes , Deconditioned looking - yes , OBESE  - yes, Sitting on Wheelchair -  no  Head:  Normocephalic, without obvious abnormality, atraumatic  Eyes:    PERRL, conjunctiva/corneas clear,  Ears:    Normal TM's and external ear canals, both ears  Nose:   Nares normal, septum midline, mucosa normal, no drainage    or sinus tenderness. OXYGEN ON  - no . Patient is @ ra   Throat:   Lips, mucosa, and tongue normal; teeth and gums normal. Cyanosis on lips - no  Neck:   Supple, symmetrical, trachea midline, no adenopathy;    thyroid:  no enlargement/tenderness/nodules; no carotid   bruit or JVD  Back:     Symmetric, no curvature, ROM normal, no CVA tenderness  Lungs:      Distress - no , Wheeze no, Barrell Chest - no, Purse lip breathing - no, Crackles - no   Chest Wall:    No tenderness or deformity.    Heart:    Regular rate and rhythm, S1 and S2 normal, no rub   or gallop, Murmur - no  Breast Exam:    NOT DONE  Abdomen:     Soft, non-tender, bowel sounds active all four quadrants,    no masses, no organomegaly. Visceral obesity - yes  Genitalia:   NOT DONE  Rectal:   NOT DONE  Extremities:   Extremities - normal, Has Cane - no, Clubbing - no, Edema - no. WALKER +  Pulses:   2+ and symmetric all extremities  Skin:   Stigmata of Connective Tissue Disease - no  Lymph nodes:   Cervical, supraclavicular, and axillary nodes normal  Psychiatric:  Neurologic:   Pleasant - yes, Anxious - no, Flat affect - mild maybe  CAm-ICU - neg, Alert and Oriented x 3 - yes, Moves all 4s - yes, Speech - normal, Cognition - intact           Assessment:       ICD-10-CM   1. Pre-operative respiratory examination  Z01.811 D-Dimer, Quantitative    CBC with Differential/Platelet    Hepatic function panel    Basic metabolic panel  2. History of pulmonary embolus (PE)  Z86.711 D-Dimer, Quantitative    CBC with Differential/Platelet    Hepatic function panel    Basic metabolic panel  3. Dyspnea on exertion  R06.00 D-Dimer, Quantitative    CBC with Differential/Platelet    Hepatic function panel    Basic metabolic panel  4. Morbid obesity (HCC)  E66.01   5. History of asthma  Z87.09   6. Diastolic dysfunction  I51.89   7. History of syncope  Z87.898        Plan:     Patient Instructions  Pre-operative respiratory examination  Plan - cannot give clearance 05/02/2019 for weight loss surgery till bleow issues we get a handle and cardiology is able to clear you regarding the near syncope issues.   History of pulmonary embolus (PE)  - glad no evidence of clot in summer 2018 and July 2020 scans (VQ and CT respestively)  Plan - check blood test d-dimer to look at  risk for recurrence and blood clot risk postoperatively after weight loss surgery - if high, will have to consider low dose blood thinner to prevent blood clot but have to balance against fall risk  Dyspnea on exertion Morbid obesity (HCC) History of asthma Diastolic dysfunction   - I think the shortness of breath is not due to residual effects of blood clot (called pulmonary hypertension) but is due to weight, and stiff heart muscles +/- asthma  History Syncope recurrent   - per Dr Glean Hess  Plan  - do d-dimer blood test - if high - do cbc, bmet, lft - depending on course might ask Dr Glean Hess to consider right heart cath if no cause for passing out is found   Followup  2 months or sooner if needed     SIGNATURE    Dr. Kalman Shan, M.D., F.C.C.P,  Pulmonary and Critical Care Medicine Staff Physician, [facility] Director - Interstitial Lung Disease  Program  Pulmonary Fibrosis [facility] at [facility] [place], [place], [address]  Pager: [phone], If no answer or between  15:00h - 7:00h: call [phone] Telephone: [phone]  11:28 AM 05/02/2019

## 2019-05-02 NOTE — Telephone Encounter (Signed)
Please let Carrie Joyce -that blood work is normal including D-dimer.  The fact that the edema dimer is normal suggest that there is no exaggerated risk of blood clot recurrence in the next year or so other than the standard risk that comes from obesity and her medical problems and the postoperative state.  Plan -She could consider taking a daily baby aspirin after talking with a hematologist in order to prevent further blood clots -From a pulmonary perspective  -she should be okay to have bariatric surgery -However would like to hold off till she completes a syncope work-up -She should just come back and see me in 2 months and then we can do a formal clearance for bariatric surgery  -Meanwhile she needs to complete her work-up for syncope with the cardiologist and please let her know that I will send a message to the cardiologist   Thank you  MR    Results for PHYLLISS, [name] (MRN [id number]) as of 05/02/2019 14:23  Ref. Range 05/02/2019 11:29  D-Dimer, Quant Latest Ref Range: <0.50 mcg/mL FEU 0.26     LABS    PULMONARY No results for input(s): PHART, PCO2ART, PO2ART, HCO3, TCO2, O2SAT in the last 168 hours.  Invalid input(s): PCO2, PO2  CBC Recent Labs  Lab 05/02/19 1129  HGB 14.8  HCT 44.2  WBC 8.8  PLT 382.0    COAGULATION No results for input(s): INR in the last 168 hours.  CARDIAC  No results for input(s): TROPONINI in the last 168 hours. No results for input(s): PROBNP in the last 168 hours.   CHEMISTRY Recent Labs  Lab 05/02/19 1129  NA 136  K 3.9  CL 100  CO2 27  GLUCOSE 132*  BUN 8  CREATININE 0.90  CALCIUM 9.4   Estimated Creatinine Clearance: 102.6 mL/min (by C-G formula based on SCr of 0.9 mg/dL).   LIVER Recent Labs  Lab 05/02/19 1129  AST 24  ALT 32  ALKPHOS 102  BILITOT 0.5  PROT 7.8  ALBUMIN 4.4     INFECTIOUS No results for input(s): LATICACIDVEN, PROCALCITON in the last 168 hours.   ENDOCRINE CBG (last 3)  No  results for input(s): GLUCAP in the last 72 hours.       IMAGING x48h  - image(s) personally visualized  -   highlighted in bold No results found.

## 2019-05-02 NOTE — Telephone Encounter (Signed)
Attempted to call patient, no answer, left message to call back.  

## 2019-05-02 NOTE — Patient Instructions (Addendum)
Pre-operative respiratory examination  Plan - cannot give clearance 05/02/2019 for weight loss surgery till bleow issues we get a handle and cardiology is able to clear you regarding the near syncope issues.   History of pulmonary embolus (PE)  - glad no evidence of clot in summer 2018 and July 2020 scans (VQ and CT respestively)  Plan - check blood test d-dimer to look at risk for recurrence and blood clot risk postoperatively after weight loss surgery - if high, will have to consider low dose blood thinner to prevent blood clot but have to balance against fall risk  Dyspnea on exertion Morbid obesity (HCC) History of asthma Diastolic dysfunction   - I think the shortness of breath is not due to residual effects of blood clot (called pulmonary hypertension) but is due to weight, and stiff heart muscles +/- asthma   History Syncope recurrent   - per Dr Sunny Schlein  Plan  - do d-dimer blood test - if high - do cbc, bmet, lft - depending on course might ask Dr Sunny Schlein to consider right heart cath if no cause for passing out is found   Followup  2 months or sooner if needed

## 2019-05-02 NOTE — Telephone Encounter (Signed)
Called and spoke to patient. Relayed results/reccomendations per Dr. Chase Caller. Patient verbalized understanding and has an upcoming cardiology appointment scheduled for syncope work up.  Dr. Chase Caller does not have a December schedule out yet so will leave this message in Emily's box to follow up to schedule 2 month follow up.

## 2019-05-02 NOTE — Telephone Encounter (Signed)
Dear Dr Sunny Schlein  Carrie Joyce -what are your thoughts that this patient might have elevated pulmonary artery pressures.  At least looking at her echo from July 2020 and CT scan it appears that the RV function is normal but wonder if she is having any post PE related elevated pulmonary artery pressures but clinically if you feel this is not likely then there would not be a need for right heart catheterization I suppose  Just wanted to share these thoughts because she told me she is going through a syncope work-up with you    Thanks    SIGNATURE    Dr. Brand Males, M.D., F.C.C.P,  Pulmonary and Critical Care Medicine Staff Physician, [place] Director - Interstitial Lung Disease  Program  Pulmonary [place] at [place], [place], [address]  Pager: [phone], If no answer or between  15:00h - 7:00h: call [phone] Telephone: [phone]  2:26 PM 05/02/2019

## 2019-05-03 ENCOUNTER — Telehealth (HOSPITAL_COMMUNITY): Payer: Self-pay | Admitting: *Deleted

## 2019-05-03 NOTE — Telephone Encounter (Signed)
Patient given detailed instructions per Myocardial Perfusion Study Information Sheet for the test on 05/09/19. Patient notified to arrive 15 minutes early and that it is imperative to arrive on time for appointment to keep from having the test rescheduled.  If you need to cancel or reschedule your appointment, please call the office within 24 hours of your appointment. . Patient verbalized understanding. Delana Manganello Jacqueline    

## 2019-05-04 NOTE — Telephone Encounter (Signed)
Murali, Call me Carrie Joyce! Yeah, I agree that I am not too keen to put her thru [place] esp with the echo report showing no suspicion of elevated RH pressures. Thank you. Have a blessed day doc

## 2019-05-09 ENCOUNTER — Other Ambulatory Visit: Payer: Self-pay

## 2019-05-09 ENCOUNTER — Ambulatory Visit (INDEPENDENT_AMBULATORY_CARE_PROVIDER_SITE_OTHER): Payer: BC Managed Care – PPO

## 2019-05-09 DIAGNOSIS — R079 Chest pain, unspecified: Secondary | ICD-10-CM

## 2019-05-09 MED ORDER — TECHNETIUM TC 99M TETROFOSMIN IV KIT
32.2000 | PACK | Freq: Once | INTRAVENOUS | Status: AC | PRN
  Administered 2019-05-09: 32.2 via INTRAVENOUS

## 2019-05-09 MED ORDER — REGADENOSON 0.4 MG/5ML IV SOLN
0.4000 mg | Freq: Once | INTRAVENOUS | Status: AC
  Administered 2019-05-09: 0.4 mg via INTRAVENOUS

## 2019-05-10 ENCOUNTER — Ambulatory Visit: Payer: BC Managed Care – PPO

## 2019-05-10 LAB — MYOCARDIAL PERFUSION IMAGING
LV dias vol: 63 mL (ref 46–106)
LV sys vol: 17 mL
Peak HR: 90 {beats}/min
Rest HR: 70 {beats}/min
SDS: 3
SRS: 0
SSS: 3
TID: 0.85

## 2019-05-10 MED ORDER — TECHNETIUM TC 99M TETROFOSMIN IV KIT
30.7000 | PACK | Freq: Once | INTRAVENOUS | Status: AC | PRN
  Administered 2019-05-10: 30.7 via INTRAVENOUS

## 2019-05-11 ENCOUNTER — Telehealth: Payer: Self-pay

## 2019-05-11 NOTE — Telephone Encounter (Signed)
-----   Message from Rajan R Revankar, MD sent at 05/11/2019  8:41 AM EDT ----- The results of the study is unremarkable. Please inform patient. I will discuss in detail at next appointment. Cc  primary care/referring physician Rajan R Revankar, MD 05/11/2019 8:41 AM 

## 2019-05-11 NOTE — Telephone Encounter (Signed)
Results relayed, patient would like a cardiac clearance sent to Dr. Volanda Napoleon [facility]). Copy also sent to [employer]. Note routed to Dr. Docia Furl addend patient office notes.

## 2019-05-12 NOTE — Telephone Encounter (Signed)
done

## 2019-05-16 NOTE — Telephone Encounter (Signed)
Dr. Chase Caller,  Please advise on pt's email if we can write letter for her bariatric surgery. Thanks.

## 2019-05-19 NOTE — Telephone Encounter (Signed)
Thanks

## 2019-05-19 NOTE — Telephone Encounter (Signed)
Per pt:  Hi, yes I seen my cardiologist and heart checked out fine. So no issue found for the syncope. Dr. said related to my syringomyelia, and that was it. Thanks, Carrie Joyce

## 2019-05-19 NOTE — Telephone Encounter (Signed)
In my clinic note I wrote that I did not want to give clearance till she finishes syncope workup and things are ok but if she cannot wait for that then you can write a letter that "from pulmonary perspective she is considered acceptble byut moderate to moderate-high risk for pulmonary complications from bariatric surgery but need clearance for syncope workup and also post op needs careful dvt prophylaxis and monitoring"

## 2019-05-22 NOTE — Telephone Encounter (Signed)
There is already a recall in pt's chart so a letter will be sent out or pt will be called to schedule appt with MR once schedule opens up.

## 2019-05-25 ENCOUNTER — Encounter: Payer: Self-pay | Admitting: *Deleted

## 2019-05-25 NOTE — Telephone Encounter (Signed)
To Whom It May Concern   Carrie Joyce is 47 year old morbidly obese lady with a previous history of pulmonary embolism status post thrombolysis.  Currently requesting bariatric surgery preoperative clearance.  I last saw her on May 02, 2019.  At that time we held off on preop pulmonary clearance because of near syncope issues.  She tells me that cardiology has cleared her in that regard.  Therefore this preoperative clearance is specifically only regarding pulmonary issues.  From a pulmonary standpoint she is at moderate moderate-high risk for pulmonary complications.  It is not prohibitive.  The main pulmonary complication would be a pulmonary embolism recurrence.  However her D-dimer in May 02, 2019 was normal suggesting the risk is lower if this test was abnormal.  Other risk would be for prolonged ventilator dependence.  For this the risk is probably low-moderate   Recommend -Aggressive DVT prophylaxis and monitoring for DVTs postoperatively -Early mobilization -Aggressive incentive spirometry -Monitoring for volume overload     SIGNATURE    Dr. Brand Males, M.D., F.C.C.P,  Pulmonary and Critical Care Medicine Staff Physician, [place] [facility] Director - Interstitial Lung Disease  Program  P[facility] [place] [facility] [place], [place], [address]  Pager: [phone], If no answer or between  15:00h - 7:00h: call [phone] Telephone: [phone]  5:22 PM 05/25/2019

## 2019-05-25 NOTE — Telephone Encounter (Signed)
Carrie Joyce- please have MR sign and then placed this up front for her to pick up thanks

## 2019-05-29 NOTE — Telephone Encounter (Signed)
Letter was signed by MR 05/26/2019 and was picked up today, 11/16. Nothing further needed.

## 2019-07-03 ENCOUNTER — Encounter: Payer: Self-pay | Admitting: Gastroenterology

## 2022-10-27 ENCOUNTER — Ambulatory Visit: Payer: BC Managed Care – PPO | Admitting: Podiatry

## 2022-10-27 DIAGNOSIS — L603 Nail dystrophy: Secondary | ICD-10-CM

## 2022-10-27 DIAGNOSIS — L600 Ingrowing nail: Secondary | ICD-10-CM | POA: Diagnosis not present

## 2022-10-27 NOTE — Patient Instructions (Signed)

## 2022-10-27 NOTE — Progress Notes (Signed)
Subjective:  Patient ID: Carrie Joyce, female    DOB: 19-Oct-1970,  MRN: [id number]  Chief Complaint  Patient presents with   Callouses    Patient came in today for a callus on the right hallux around the nail, patient also has neuropathy bilateral, this all started after she had back surgery a year ago, patient has tried gabapentin for 3 months,     52 y.o. female presents with concern for pain on the right hallux nail due to skin overgrowing the nail with a nail growing into the distal tuft of skin.  She says that she has to have this frequently trimmed off to prevent pain and now happens every 2 weeks that she has to fix the nail.  She had an injury to the nail a long time ago and it is never grown correctly since then.  She has previously been told by Dr. Marylene Land that she may need to have the nail completely removed if it continues to be a problem for her.  She does have neuropathy in both feet due to spinal issues.  Past Medical History:  Diagnosis Date   ACL tear 2015   Anxiety    Asthma    Blood dyscrasia    Cervical spinal cord injury (HCC)    COPD (chronic obstructive pulmonary disease) (HCC)    Depression    Dyspnea    Essential hypertension    Factor V Leiden (HCC)    Factor V Leiden, prothrombin gene mutation (HCC)    Falls frequently    Fibromyalgia    Headache    Insomnia    Interstitial cystitis    Low back pain    Migraines    Multiple thyroid nodules    Myalgia    Numbness and tingling of foot    Pulmonary emboli (HCC)    felt secondary to estrogen for dysfunctional uterine bleeding and clotting disorder - followed by Hematology   Secondary right ventricular dilation 10/05/2016   Spondylosis of lumbar region without myelopathy or radiculopathy 05/2018   Syringomyelia (HCC)    Syrinx of spinal cord (HCC) 09/2016   Tear of MCL (medial collateral ligament) of knee 2015    Allergies  Allergen Reactions   Ciprofloxacin Nausea And Vomiting and Hives   Sulfa  Antibiotics Hives and Swelling   Codeine Nausea And Vomiting and Nausea Only   Penicillins Hives and Other (See Comments)    Has patient had a PCN reaction causing immediate rash, facial/tongue/throat swelling, SOB or lightheadedness with hypotension: No Has patient had a PCN reaction causing severe rash involving mucus membranes or skin necrosis: No Has patient had a PCN reaction that required hospitalization: Yes Has patient had a PCN reaction occurring within the last 10 years: No If all of the above answers are "NO", then may proceed with Cephalosporin use.    Prochlorperazine Itching    ROS: Negative except as per HPI above  Objective:  General: AAO x3, NAD  Dermatological: Dystrophy of the right hallux nail and the distal tuft of the nail is growing into the nailbed.  Mild pain with palpation of the area.  No erythema or drainage from the area.  Vascular:  Dorsalis Pedis artery and Posterior Tibial artery pedal pulses are 2/4 bilateral.  Capillary fill time < 3 sec to all digits.   Neruologic: Grossly intact via light touch bilateral. Protective threshold intact to all sites bilateral.   Musculoskeletal: No gross boney pedal deformities bilateral. No pain, crepitus, or limitation  noted with foot and ankle range of motion bilateral. Muscular strength 5/5 in all groups tested bilateral.  Gait: Unassisted, Nonantalgic.   No images are attached to the encounter.   Assessment:   1. Ingrown nail of great toe of right foot   2. Nail dystrophy      Plan:  Patient was evaluated and treated and all questions answered.    Ingrown Nail, right total nail due to nail dystrophy from prior injury -Patient elects to proceed with minor surgery to remove ingrown toenail today. Consent reviewed and signed by patient. -Ingrown nail excised. See procedure note. -Educated on post-procedure care including soaking. Written instructions provided and reviewed. -Patient to follow up in 2  weeks for nail check.  Procedure: Excision of Ingrown Toenail Location: Right 1st toe  total  nail  Anesthesia: Lidocaine 1% plain; 1.5 mL and Marcaine 0.5% plain; 1.5 mL, digital block. Skin Prep: Betadine. Dressing: Silvadene; telfa; dry, sterile, compression dressing. Technique: Following skin prep, the toe was exsanguinated and a tourniquet was secured at the base of the toe. The affected nail border was freed, split with a nail splitter, and excised. Chemical matrixectomy was then performed with phenol and irrigated out with alcohol. The tourniquet was then removed and sterile dressing applied. Disposition: Patient tolerated procedure well. Patient to return in 2 weeks for follow-up.    Return in about 2 weeks (around 11/10/2022) for nail check .          Corinna Gab, DPM [facility] / [facility]

## 2022-11-10 ENCOUNTER — Ambulatory Visit: Payer: BC Managed Care – PPO | Admitting: Podiatry

## 2022-11-10 DIAGNOSIS — L603 Nail dystrophy: Secondary | ICD-10-CM | POA: Diagnosis not present

## 2022-11-10 DIAGNOSIS — L600 Ingrowing nail: Secondary | ICD-10-CM

## 2022-11-10 NOTE — Progress Notes (Signed)
Subjective: Carrie Joyce Auth is a 52 y.o.  female returns to office today for follow up evaluation after having right Hallux total nail ingrown removal with phenol and alcohol matrixectomy approximately 2 weeks ago. Patient has been soaking using epsom salts and applying topical antibiotic covered with bandaid daily. Patient denies fevers, chills, nausea, vomiting. Denies any calf pain, chest pain, SOB.   Objective:  Vitals: Reviewed  General: Well developed, nourished, in no acute distress, alert and oriented x3   Dermatology: Skin is warm, dry and supple bilateral. right hallux nail border appears to be clean, dry, with mild granular tissue and surrounding scab. There is no surrounding erythema, edema, drainage/purulence. The remaining nails appear unremarkable at this time. There are no other lesions or other signs of infection present.  Neurovascular status: Intact. No lower extremity swelling; No pain with calf compression bilateral.  Musculoskeletal: Decreased tenderness to palpation of the right hallux nail bed. Muscular strength within normal limits bilateral.   Assesement and Plan: S/p phenol and alcohol matrixectomy to the  right hallux nail , doing well.   -Continue soaking in epsom salts twice a day followed by antibiotic ointment and a band-aid. Can leave uncovered at night. Continue this until completely healed.  -If the area has not healed in 2 weeks, call the office for follow-up appointment, or sooner if any problems arise.  -Monitor for any signs/symptoms of infection. Call the office immediately if any occur or go directly to the emergency room. Call with any questions/concerns.        Corinna Gab, DPM [facility] / [facility]                   11/10/2022

## 2024-05-02 NOTE — Pre-Procedure Assessment (Signed)
 PASS-Perioperative Anesthesia & Surgical Screening Phone Screen interview ALERTS:   Sleep apnea (wears CPAP) History of anesthetic complications (PONV)  FALLS RISK - RECENT FALL WALKS WITH WALKER   Travel screening was performed on this patient at the time of their PASS evaluation  Procedure:   05/25/2024 Left - ARTHROSCOPICALLY AIDED ANTERIOR CRUCIATE LIGAMENT REPAIR/AUGMENTATION OR RECONSTRUCTION WITH QUAD TENDON AUTOGRAFT - Left [facility] OR   CC/HPI  Carrie Joyce is a 53 y.o. female with history of Factor V leiden, asthma, PE's and cervical/thoracic syrinx, weakness, with left ACL tear.    Vitals (36hrs): Height:  [162.6 cm (5' 4)] 162.6 cm (5' 4) Weight:  [104.3 kg (230 lb)] 104.3 kg (230 lb) BMI (Calculated):  [39.46] 39.46 Relevant Risk Scores: STOP BANG: 6 [facility] Activity Status Index (DASI): 24.95 Wt Readings from Last 3 Encounters:  05/02/24 (!) 104.3 kg (230 lb)  03/08/24 (!) 104.3 kg (230 lb)  11/25/23 (!) 103 kg (227 lb)   Temp Readings from Last 3 Encounters:  05/05/22 36.1 C (97 F) (Temporal)  01/14/22 36.9 C (98.4 F) (Oral)  01/02/22 (!) 35.9 C (96.7 F)   BP Readings from Last 3 Encounters:  03/08/24 127/44  11/25/23 118/82  05/05/22 119/83   Pulse Readings from Last 3 Encounters:  03/08/24 89  11/25/23 84  05/05/22 78    Medical History                                    Pulmonary .  Sleep apnea (wears CPAP): CPAP, Noncompliant .  Asthma (rare use of albuterol  inhaler): controlled .  COPD (resent test - negative): SABRA  Pulmonary embolism (11 total PE's.  Most recent 07/2017.  Off AC) Denies recent cough, SOB, wheezing, or problems breathing.  Cardiovascular  .  Exercise tolerance: >4 METS (can do 1 fos without sob/cp, does own housework and shopping) .  Antiplatelet therapy (taking 81 mg aspirin daily since injury)  .  EKG (07/05/22 per ce: Normal sinus rhythm  Right bundle branch block  Abnormal ECG) .   Echocardiogram (09/05/1991 NORMAL ECHO-DOPPLER STUDY NO VALVULAR REGURGITATION NOTED) .  Pharmacological stress test No recent cardiac issues.  MYOCARDIAL PERFUSION IMAGING 05/10/2019 ([facility]) Perfusion Summary Defect 1: There is a small defect of mild severity present in the apical anterior location. The defect is consistent with artifact. Overall Study Impression Myocardial perfusion is normal. Overall left ventricular systolic function was normal. LV cavity size is normal. Nuclear stress EF: 72%. The left ventricular ejection fraction is hyperdynamic (>65%).    Gastrointestinal .  Gastric Surgery (Roux en Y Bypass 2019): Gastric Bypass  GU/Renal .  Nephrolithiasis (Hx of kidney stones)   Neuro/Spine/CNS .  Neuropathy (B/L feet and hands) .  Seizure disorder (Hx of seizure associated with migraines.  Off seizure meds, no recent seizures), controlled:  Cervical syrinx Thoracic Syrinx  s/p Lamninoplasty C7-T1 for insertion of shunt 08/15/21  History of migraines Hematology/Oncology  . Coagulation disorder (Not on [facility])      Factor V Leiden Patient reports that her outside hematologist d/c'd her lovenox therapy  Endocrine .  Obesity: Class II obesity  Musculoskeletal acute left knee pain status post falling from a roof on 03/02/2024  Pain .  Chronic pain: : musculoskeletal, neuropathic GYN  Pregnancy status: no .  Having periods Patient's last menstrual period was 04/21/2024. Infectious Disease Per patient statement no history of Hepatitis,  Tuberculosis or MRSA exposure; no foreign travel in the last 30 days.  Covid + 01/19/24 per [facility] Psych . Anxiety . Depression    Review of Systems   A comprehensive 10-system ROS was asked and was negative except for the following:  GI: PONV, responds to medication, constipation NEURO/SPINE/CNS: gait problem (Left foot drop), weakness, tingling/numbness MUSCULOSKELETAL: back pain   Past Surgical History   Past Surgical History:   Procedure Laterality Date  . BACK SURGERY    . CHOLECYSTECTOMY    . COLONOSCOPY W/BIOPSY N/A 05/05/2022   Procedure: Colonoscopy;  Surgeon: Geraldean Begun, MD;  Location: [facility] ENDO/[facility];  Service: Gastroenterology;  Laterality: N/A;  . INSERTION SUBARACHNOID/SUBDURAL-PERITONEAL SHUNT N/A 08/15/2021   Procedure: INSERTION SUBARACHNOID/SUBDURAL-PERITONEAL SHUNT;  Surgeon: Jolaine Charmaine Flair, MD;  Location: [facility] OPERATING ROOMS;  Service: Neurosurgery;  Laterality: N/A;  . LAMINECTOMY CERVICLE FOR EXCISON INTRADURAL INTRASPINAL LESION Bilateral 08/15/2021   Procedure: Lamninoplasty C7-T1 for insertion of shunt   Standard OR table, Chest Rolls, [place] Skull Clamp, Arms tucked, Neuromonitoring with nerve stimulation, Bone Scalpel with 25mm blade, Depuy Laminoplasty tray. Rhoton microdiscetor, [employer], yellow 0.48mm spetzler tip bipolar, aquamanys, fluoroscopic xray  LAMINECTOMY FOR EXCISION OF INTRASPINAL LESION OTHER THAN NEOPLASM, INTRADURAL  . LAMINECTOMY THORACIC FOR EXCISON INTRADURAL INTRASPINAL LESION Bilateral 08/15/2021   Procedure: LAMINECTOMY FOR EXCISION OF INTRASPINAL LESION OTHER THAN NEOPLASM, INTRADURAL; THORACIC;  Surgeon: Jolaine Charmaine Flair, MD;  Location: [facility] OPERATING ROOMS;  Service: Neurosurgery;  Laterality: Bilateral;  . LAMINOPLASTY CERVICLE SPINE W/DECOMP & RECONSTRUCTION 2/MORE SEGMENTS Bilateral 08/15/2021   Procedure: LAMINOPLASTY, CERVICAL, W/DECOMPRESSION OF SPINAL CORD, 2 OR MORE VERTEBRAL SEGMENTS; W/RECONSTRUCTION POSTERIOR BONY ELEMENTS;  Surgeon: Jolaine Charmaine Flair, MD;  Location: [facility] OPERATING ROOMS;  Service: Neurosurgery;  Laterality: Bilateral;  . ROUX-EN-Y PROCEDURE  06/2019    Social/Family History   Social History   Occupational History  . Not on file  Tobacco Use  . Smoking status: Never  . Smokeless tobacco: Never  Vaping Use  . Vaping status: Never Used  Substance and Sexual Activity  . Alcohol use: Not Currently  . Drug  use: No  . Sexual activity: Yes    Partners: Male    Birth control/protection: I.U.D.   Vaping/E-Cigarettes  . Vaping/E-Cigarette Use Never User   . Start Date    . Cartridges/Day    . Quit Date     Family History  Problem Relation Age of Onset  . High blood pressure (Hypertension) Mother   . Rheum arthritis Mother   . Fibromyalgia Mother   . Heart disease Maternal Grandmother   . High blood pressure (Hypertension) Maternal Grandmother   . Esophageal cancer Maternal Grandmother   . Brain/spinal defects Daughter   . Breast cancer Maternal Aunt   . High blood pressure (Hypertension) Maternal Aunt   . Rheum arthritis Maternal Aunt   . Rheum arthritis Cousin   . Thyroid disease Neg Hx   . Glaucoma Neg Hx   . Macular degeneration Neg Hx   . Colon cancer Neg Hx   . Colon polyps Neg Hx   . Stomach cancer Neg Hx   . Liver cancer Neg Hx   . Liver disease Neg Hx   . Pancreatic cancer Neg Hx   . Inflammatory bowel disease Neg Hx    Allergies   Allergies  Allergen Reactions  . Meclizine Other (See Comments)    Skin crawling, felt like she was losing her mind  . Metronidazole Rash and Unknown  . Prochlorperazine Itching and  Hallucination    Provider: Lilian Domino [facility] - Allergy Description: COMPAZINE, 10MG  (PO Tablet) CFM - Allergy Annotation:Allscripts Description: Compazine TABS Allscripts Description: Compazine TABS   . Sulfa (Sulfonamide Antibiotics) Hives and Swelling  . Sulfasalazine Hives and Swelling  . Ciprofloxacin Hives  . Codeine Nausea and Vomiting    Allscripts Description: Codeine Derivatives Allscripts Description: Codeine Derivatives   . Keppra [Levetiracetam] Nausea    Hives  . Penicillins Hives    Provider: Lilian Domino [facility] - Allergy Description: Penicillins CFM - Allergy Annotation:   Medications   Current Outpatient Medications  Medication  . aspirin 81 MG EC tablet  . cefdinir (OMNICEF) 300 mg capsule  . clindamycin (CLEOCIN) 150 MG capsule  .  doxycycline (VIBRAMYCIN) 100 MG capsule  . fluticasone propionate (FLONASE) 50 mcg/actuation nasal spray  . HYDROcodone-acetaminophen  (NORCO) 5-325 mg tablet  . modafiniL (PROVIGIL) 200 MG tablet  . nitrofurantoin, macrocrystal-monohydrate, (MACROBID) 100 MG capsule  . omeprazole (PRILOSEC) 40 MG DR capsule  . predniSONE (DELTASONE) 10 MG tablet  . promethazine-dextromethorphan (PROMETHAZINE-DM) 6.25-15 mg/5 mL syrup  . rOPINIRole (REQUIP) 1 MG immediate release tablet  . sodium, potassium, and magnesium (SUPREP) oral solution  . topiramate  (TOPAMAX ) 100 MG tablet  . plecanatide (TRULANCE) 3 mg tablet  . UBRELVY 100 mg Tab  . traZODone (DESYREL) 50 MG tablet  . lubiprostone (AMITIZA) 8 MCG capsule  . LORazepam (ATIVAN) 1 MG tablet  . metoclopramide (REGLAN) 10 MG tablet  . montelukast (SINGULAIR) 10 mg tablet  . ergocalciferol, vitamin D2, 1,250 mcg (50,000 unit) capsule  . zonisamide (ZONEGRAN) 25 MG capsule  . ondansetron  (ZOFRAN -ODT) 4 MG disintegrating tablet  . cyclobenzaprine (FLEXERIL) 10 MG tablet  . pregabalin (LYRICA) 75 MG capsule  . magnesium citrate oral solution  . food supplemt, lactose-reduced (NUTRITIONAL SHAKE) Liqd  . promethazine (PHENERGAN) 25 MG suppository  . hydrOXYzine  (ATARAX ) 25 MG tablet  . calcium citrate 250 mg calcium Tab  . BIOFREEZE, MENTHOL, TOPICAL  . promethazine (PHENERGAN) 12.5 MG tablet  . DULoxetine  (CYMBALTA ) 60 MG DR capsule  . magnesium oxide 400 mg magnesium Tab  . multivit-min/iron/folic acid/K (BARIATRIC MULTIVITAMINS ORAL)  . albuterol  90 mcg/actuation inhaler   No current facility-administered medications for this visit.   Physical Exam  Phys Exam Test Results    No results found for: WBC, HGB, HCT, PLT, CHOL, TRIG, HDL, LDLDIRECT, ALT, AST, GLUCOSE, NA, K, CL, CALCIUM, MG, CREATININE, BUN, CO2, ALB, TSH, PSA, HGBA1C, INR, PT, APTT, VITD          Patient Instructions       Medication Sig INSTRUCTIONS  . albuterol  90 mcg/actuation inhaler Inhale 1 inhalation into the lungs every 6 (six) hours as needed for Shortness of Breath.   TAKE day of procedure, if needed  . aspirin 81 MG EC tablet Take 81 mg by mouth Follow the surgeon's instructions ( prescribed by ortho due to Leiden V, pt stated she was told to continue taking)  . BIOFREEZE, MENTHOL, TOPICAL Apply topically as needed DO NOT TAKE day of procedure  . calcium citrate 250 mg calcium Tab Take 1 tablet by mouth once daily DO NOT TAKE day of procedure  . cyclobenzaprine (FLEXERIL) 10 MG tablet Take 10 mg by mouth at bedtime as needed TAKE night before procedure if needed.  . DULoxetine  (CYMBALTA ) 60 MG DR capsule Take 60 mg by mouth every morning TAKE day of procedure  . ergocalciferol, vitamin D2, 1,250 mcg (50,000 unit) capsule Take 1 capsule by mouth once  per week DO NOT TAKE day of procedure  . fluticasone propionate (FLONASE) 50 mcg/actuation nasal spray 1 spray TAKE day of procedure, if needed  . food supplemt, lactose-reduced (NUTRITIONAL SHAKE) Liqd Take by mouth once daily DO NOT TAKE day of procedure  . magnesium oxide 400 mg magnesium Tab Take 400 mg by mouth at bedtime DO NOT TAKE day of procedure  . montelukast (SINGULAIR) 10 mg tablet Take 10 mg by mouth once daily TAKE day of procedure  . multivit-min/iron/folic acid/K (BARIATRIC MULTIVITAMINS ORAL) Take 2 tablets by mouth once daily STOP taking 7 days prior to procedure  . ondansetron  (ZOFRAN -ODT) 4 MG disintegrating tablet DISSOLVE 1 TABLET ON THE TONGUE EVERY 8 HOURS AS NEEDED FOR NAUSEA TAKE day of procedure, if needed  . plecanatide (TRULANCE) 3 mg tablet Take 3 mg by mouth once daily TAKE day of procedure  . pregabalin (LYRICA) 75 MG capsule Take 1 capsule (75 mg total) by mouth 2 (two) times daily for 30 days (Patient taking differently: Take 75 mg by mouth 3 (three) times daily) TAKE day of procedure  . promethazine (PHENERGAN) 12.5 MG  tablet Take 1 tablet (12.5 mg total) by mouth every 6 (six) hours as needed for Nausea or Vomiting TAKE day of procedure, if needed  . promethazine (PHENERGAN) 25 MG suppository Place 25 mg rectally every 6 (six) hours as needed for Nausea TAKE day of procedure, if needed  . topiramate  (TOPAMAX ) 100 MG tablet Take 100 mg by mouth at bedtime TAKE night before procedure  . traZODone (DESYREL) 50 MG tablet Take 50 mg by mouth at bedtime as needed TAKE night before procedure if needed.  . UBRELVY 100 mg Tab take 1 tablet by mouth as needed for migraine, may repeat dose once after 2 hours if needed TAKE day of procedure, if needed   Pre-Operative Instructions for [facility]):   A nurse from [facility] will call you the afternoon of the business day prior to surgery to give you the time of arrival on the day of surgery.  That nurse will also provide eating and drinking instructions for surgery.  If you do not receive a telephone call by 3:00 pm the business day before your surgery, please call the surgery center at [phone] to speak with a Preop Nurse.    If you received bathing instructions/sponges from your surgeon, please follow those instructions.  If you did not receive bathing instructions/sponges from your surgeon, please take a shower with an antibacterial soap (ie. Dial) the night before and the morning of surgery.  After that morning shower, do not apply deodorant, lotion, make up, perfumes or powder to your skin.  Do not wear contact lenses.  All jewelry, including body jewelry, and nail polish should to be removed prior to surgery.  On the day of surgery, please bring a government-issued photo ID, insurance card and any payment you may owe.  If your surgeon has told you that you will be spending the night after your surgery, please bring all home medications with you on the day of surgery.   If your surgeon or  an anesthesia representative has told you that your procedure  is an ERAS procedure (ERAS stands for Enhanced Recovery After Surgery), please drink 12 ounces of water the day of your procedure but stop drinking that water 2 hours prior to arrival at the [facility] on day of surgery.  If you have been given a carbohydrate drink called Clearfast, please do not  drink it day of surgery.      If you have been diagnosed with sleep apnea and use a CPAP machine at night, please bring with machine/supplies with you on the day of surgery.   A responsible adult (23 years of age or older) must accompany each patient day of surgery and stay the entire time.    [facility]) is located at [address], [place], [place]  [address].    Parking for the [facility]):  As you drive on [address], please take the first left (there is a blue sign on the left where you should turn).  Once you turn left, you will come to a stop sign where you should turn left and drive down the brick road.  You will then turn right into the patient/visitor entrance.  Please try to park on the 4th floor of the garage, as there is an entrance into the ASC from this floor.    Problem List   Patient Active Problem List  Diagnosis  . ACL tear  . Tear of MCL (medial collateral ligament) of knee  . Sprain of lateral collateral ligament of right knee, initial encounter  . Seizure-like activity (CMS/HHS-HCC)  . Syrinx of spinal cord (CMS-HCC)  . Muscle spasm  . Numbness and tingling of foot  . Sacroiliitis ()  . Acute neck pain  . Chronic left-sided low back pain with left-sided sciatica  . Syringomyelia (CMS/HHS-HCC)  . Spondylosis of lumbosacral region without myelopathy or radiculopathy  . Spondylosis of lumbar region without myelopathy or radiculopathy  . Monocular diplopia  . Macular pigment epithelial detachment, bilateral  . OSA (obstructive sleep apnea)  . Acute postoperative pain  . Morbid obesity with BMI of 40.0-44.9 (HCC)  . Acute  pain of left knee   Assessment & Plan  History of anesthetic complications (PONV)    ELLEN LUNSFORD, RN Day of Surgery  Day of Surgery Exam

## 2024-05-16 ENCOUNTER — Inpatient Hospital Stay: Payer: Self-pay | Attending: Oncology | Admitting: Oncology

## 2024-05-16 ENCOUNTER — Encounter: Payer: Self-pay | Admitting: Oncology

## 2024-05-16 ENCOUNTER — Other Ambulatory Visit: Payer: Self-pay | Admitting: Oncology

## 2024-05-16 ENCOUNTER — Inpatient Hospital Stay: Payer: Self-pay

## 2024-05-16 VITALS — BP 127/90 | HR 67 | Temp 98.2°F | Resp 16 | Ht 64.0 in | Wt 230.5 lb

## 2024-05-16 DIAGNOSIS — D6859 Other primary thrombophilia: Secondary | ICD-10-CM

## 2024-05-16 DIAGNOSIS — D6852 Prothrombin gene mutation: Secondary | ICD-10-CM | POA: Insufficient documentation

## 2024-05-16 DIAGNOSIS — X58XXXA Exposure to other specified factors, initial encounter: Secondary | ICD-10-CM | POA: Diagnosis not present

## 2024-05-16 DIAGNOSIS — G950 Syringomyelia and syringobulbia: Secondary | ICD-10-CM | POA: Insufficient documentation

## 2024-05-16 DIAGNOSIS — D6851 Activated protein C resistance: Secondary | ICD-10-CM

## 2024-05-16 DIAGNOSIS — Z803 Family history of malignant neoplasm of breast: Secondary | ICD-10-CM | POA: Insufficient documentation

## 2024-05-16 DIAGNOSIS — Z809 Family history of malignant neoplasm, unspecified: Secondary | ICD-10-CM | POA: Insufficient documentation

## 2024-05-16 DIAGNOSIS — S8992XA Unspecified injury of left lower leg, initial encounter: Secondary | ICD-10-CM | POA: Diagnosis not present

## 2024-05-16 DIAGNOSIS — I2699 Other pulmonary embolism without acute cor pulmonale: Secondary | ICD-10-CM

## 2024-05-16 LAB — PROTIME-INR
INR: 1 (ref 0.8–1.2)
Prothrombin Time: 13.5 s (ref 11.4–15.2)

## 2024-05-16 LAB — CBC WITH DIFFERENTIAL (CANCER CENTER ONLY)
Abs Immature Granulocytes: 0.01 K/uL (ref 0.00–0.07)
Basophils Absolute: 0 K/uL (ref 0.0–0.1)
Basophils Relative: 1 %
Eosinophils Absolute: 0.1 K/uL (ref 0.0–0.5)
Eosinophils Relative: 2 %
HCT: 41 % (ref 36.0–46.0)
Hemoglobin: 13.9 g/dL (ref 12.0–15.0)
Immature Granulocytes: 0 %
Lymphocytes Relative: 29 %
Lymphs Abs: 1.8 K/uL (ref 0.7–4.0)
MCH: 32 pg (ref 26.0–34.0)
MCHC: 33.9 g/dL (ref 30.0–36.0)
MCV: 94.5 fL (ref 80.0–100.0)
Monocytes Absolute: 0.4 K/uL (ref 0.1–1.0)
Monocytes Relative: 6 %
Neutro Abs: 3.8 K/uL (ref 1.7–7.7)
Neutrophils Relative %: 62 %
Platelet Count: 348 K/uL (ref 150–400)
RBC: 4.34 MIL/uL (ref 3.87–5.11)
RDW: 12.1 % (ref 11.5–15.5)
WBC Count: 6.1 K/uL (ref 4.0–10.5)
nRBC: 0 % (ref 0.0–0.2)

## 2024-05-16 LAB — CMP (CANCER CENTER ONLY)
ALT: 17 U/L (ref 0–44)
AST: 19 U/L (ref 15–41)
Albumin: 3.9 g/dL (ref 3.5–5.0)
Alkaline Phosphatase: 177 U/L — ABNORMAL HIGH (ref 38–126)
Anion gap: 8 (ref 5–15)
BUN: 9 mg/dL (ref 6–20)
CO2: 26 mmol/L (ref 22–32)
Calcium: 8.6 mg/dL — ABNORMAL LOW (ref 8.9–10.3)
Chloride: 104 mmol/L (ref 98–111)
Creatinine: 0.68 mg/dL (ref 0.44–1.00)
GFR, Estimated: >60 mL/min (ref >60)
Glucose, Bld: 94 mg/dL (ref 70–99)
Potassium: 4.5 mmol/L (ref 3.5–5.1)
Sodium: 138 mmol/L (ref 135–145)
Total Bilirubin: 0.5 mg/dL (ref 0.0–1.2)
Total Protein: 6.8 g/dL (ref 6.5–8.1)

## 2024-05-16 LAB — APTT: aPTT: 28 s (ref 24–36)

## 2024-05-16 MED ORDER — ENOXAPARIN SODIUM 100 MG/ML IJ SOSY
100.0000 mg | PREFILLED_SYRINGE | Freq: Two times a day (BID) | INTRAMUSCULAR | 0 refills | Status: AC
Start: 2024-05-16 — End: ?

## 2024-05-16 NOTE — Progress Notes (Signed)
 [facility]  [address] [place],  [place]  [address] [phone]  Clinic Day:  05/16/24  Referring physician: Trudy Carrie MATSU, *  ASSESSMENT & PLAN:  Assessment: Prothrombin Gene Mutation This has caused an hypercoagulable state and she has had prior DVT as well as recurrent pulmonary emboli. These involved both main pulmonary arteries and all lobes of both lungs. Evaluation revealed that she is negative for factor V Leiden and other clotting abnormalities but she is heterozygous for the prothrombin gene mutation. Her daughter has the same finding. I think she is stable to proceed with surgery but will cover her with Lovenox pre-op and 1 month post-op. She can then go back to her aspirin 81 mg daily. That will be stopped 1 week prior to her surgery. I have reviewed all of this with her and will order Lovenox 1 mg per kg, i.e. 100 mg sub-q BID.   Positive Family History of Breast Cancer Her maternal aunt had breast cancer in her 32's and her maternal grandmother died of cancer but circumstances are unknown. The patient has been tested and is negative for any significant gene mutation.   Syringomyelia  This causes her imbalance and chronic pain but she has had a shunt placed.   Severe Left Knee Injury She fell off the roof while working and had severe injuries to the ACL, MCL, and LCL so will need surgical correction. We will cover her with Lovenox pre and post-op.   Plan: Carrie Joyce is seen in the clinic for follow up of her prothrombin gene mutation. She was last seen in my clinic in 2019 for prophylactic anticoagulation prior to bariatric surgery. She was found to be heterozygous for the prothrombin gene mutation [id number] (factor II). Patient is scheduled for a left arthroscopically aided anterior cruciate ligament repair or reconstruction and repair of fibular collateral ligament sprain and medial collateral ligament injury on 05/25/2024. She has already been going to  physical therapy for the last 2 months. I reminded her that she will need to be on low molecular weight heparin  2-3 days before surgery BID and to stop it 24hrs before surgery. I will prescribe Lovenox 100 mg sub-q BID for 2 days pre-op and 1 month post-op due to the fact she will be sedentary. She continues aspirin 81 mg once daily and I instructed her to stop this a week before her surgery and to resume at 30 days post-op. Her BP today is elevated at 127/90 and she informed me that her BP has been elevated since her accident. I informed her of the importance of regulating her BP before surgery and she will contact her PCP for assistance with this. She has a WBC of 6.1, hemoglobin of 13.9, and platelet count of 348,000. Her CMP is normal other than a low calcium of 8.6 and an elevated alkaline phosphatase of 177. Her APTT and PT/INR today are pending and I will call her with the results. Patient states that her last mammogram was 2 years ago and I encouraged her to resume having them done annually. She is still peri-menopausal but I advised her to have a baseline bone density scan once she is post-menopausal. I will see her back as needed. I discussed the assessment and treatment plan with the patient, and wrote this down.  The patient was provided an opportunity to ask questions and all were answered.  The patient agreed with the plan and demonstrated an understanding of the instructions.  The patient was advised  to call back if the symptoms worsen or if further questions arise.  Thank you for the opportunity to care for your patients.   I provided 32 minutes of face-to-face time during this this encounter and > 50% was spent counseling as documented under my assessment and plan.   Carrie VEAR Cornish, MD [facility] - A DEPT OF [facility] [address] [place] [address] Dept: [phone] Dept Fax: [phone]   I, Jasmine Lassiter, am  acting as scribe for Carrie HILARIO Cornish, MD  I have reviewed this report as typed by the medical scribe, and it is complete and accurate.  Carrie VEAR Cornish, MD   11/7/20257:01 AM  CHIEF COMPLAINT:  CC: Prothrombin Gene Mutation  Current Treatment:  Aspirin 81 mg once daily   HISTORY OF PRESENT ILLNESS:  Carrie Joyce is a 53 y.o. female with a history of recurrent pulmonary emboli who is referred in consultation by Dr. Arnaldo Cain for assessment and management of hypercoagulable state. She had been on Megace  for several months for dysfunctional uterine bleeding in 2018. She developed pulmonary emboli June 14, 2016, ultrasound of both lower extremities were negative at that time but had been positive a month before. This pulmonary emboli was noted in both main pulmonary arteries extending into both lobes and there was evidence of right heart strain. She was placed on Xarelto  and Megace  was stopped but she did develop acute recurrent pulmonary emboli. She is a nonsmoker and had no other predisposing factors. There is no family history of blood clots, strokes, or coronary thrombosis. An extensive lab evaluation for hypercoagulable state was collected and she was found to be heterozygous for the prothrombin (factor II) gene mutation [id number]. Her factor V Leiden mutation was negative, protein S and protein C were normal, lupus anticoagulant or phospholipid syndrome were negative, and antithrombin 3 was normal. Her daughter has been tested and was found to be heterozygous for the prothrombin gene mutation. She therefore has been advised to take prophylactic anticoagulation with any surgical procedures. She did so prior to her bariatric surgery in 2019. She also had cervical spine surgery with placement of an shunt for syringomyelia and was covered with Lovenox during the perioperative period also. She reports chronic back pain and imbalance due to her syringomyelia. She has a strong family  history of breast cancer including a maternal aunt diagnosed in her 59's, but genetic testing did not reveal any significant gene mutation.   I have reviewed her chart and materials related to her cancer extensively and collaborated history with the patient. Summary of oncologic history is as follows: Oncology History   No history exists.   INTERVAL HISTORY:  Carrie Joyce is seen in the clinic for follow up of her prothrombin gene mutation. She was last seen in my clinic in 2019 for prophylactic anticoagulation prior to bariatric surgery. She was found to be heterozygous for the prothrombin gene mutation [id number] (factor II). Patient states that she feels well but complains of an aching pain in her left knee rating 6/10. Patient is scheduled for a left arthroscopically aided anterior cruciate ligament repair or reconstruction and repair of fibular collateral ligament sprain and medial collateral ligament injury on 05/25/2024. She has already been going to physical therapy for the last 2 months. I reminded her that she will need to be on low molecular weight heparin  2-3 days before surgery BID and to stop it 24hrs before surgery. I will  prescribe Lovenox 100 mg sub-q BID due to the fact she will be sedentary for 2 days pre-op and 1 month post-op. She continues aspirin 81 mg once daily and I instructed her to stop this a week before her surgery and to resume at 30 days post-op. Her BP today is elevated at 127/90 and she informed me that her BP has been elevated since her accident. I informed her of the importance of regulating her BP before surgery and she will contact her PCP for assistance with this. She has a WBC of 6.1, hemoglobin of 13.9, and platelet count of 348,000. Her CMP is normal other than an low calcium of 8.6 and an elevated alkaline phosphatase of 177. Her APTT and protime-INR today are pending and I will call her with the results. Patient states that her last mammogram was 2 years ago and I  encouraged her to resume having them done annually. She is still peri-menopausal but I advised her to have a baseline bone density scan once she is post-menopausal. I will see her back as needed.   She denies fever, chills, night sweats, or other signs of infection. She denies cardiorespiratory and gastrointestinal issues. She denies pain. Her appetite is good. Her weight is 230lbs.   HISTORY:   Past Medical History:  Diagnosis Date   ACL tear 2015   Anxiety    Asthma    Blood dyscrasia    Cervical spinal cord injury (HCC)    COPD (chronic obstructive pulmonary disease) (HCC)    Depression    DVT (deep venous thrombosis) (HCC)    Dyspnea    Essential hypertension    Falls frequently    Fibromyalgia    Headache    Insomnia    Interstitial cystitis    Low back pain    Migraines    Multiple thyroid nodules    Myalgia    Numbness and tingling of foot    Prothrombin gene mutation    Pulmonary emboli (HCC)    felt secondary to estrogen for dysfunctional uterine bleeding and clotting disorder - followed by Hematology   Secondary right ventricular dilation 10/05/2016   Spondylosis of lumbar region without myelopathy or radiculopathy 05/2018   Syringomyelia (HCC)    Syrinx of spinal cord (HCC) 09/2016   Tear of MCL (medial collateral ligament) of knee 2015    Past Surgical History:  Procedure Laterality Date   BARIATRIC SURGERY  2019   CERVICAL SPINE SURGERY     CHOLECYSTECTOMY     IR GENERIC HISTORICAL  06/15/2016   IR ANGIOGRAM SELECTIVE EACH ADDITIONAL VESSEL 06/15/2016 Toribio Faes, MD [facility]   IR GENERIC HISTORICAL  06/15/2016   IR US  GUIDE VASC ACCESS RIGHT 06/15/2016 Toribio Faes, MD [facility]   IR GENERIC HISTORICAL  06/15/2016   IR ANGIOGRAM SELECTIVE EACH ADDITIONAL VESSEL 06/15/2016 Toribio Faes, MD [facility]   IR GENERIC HISTORICAL  06/15/2016   IR ANGIOGRAM PULMONARY BILATERAL SELECTIVE 06/15/2016 Toribio Faes, MD [facility]   IR GENERIC  HISTORICAL  06/15/2016   IR INFUSION THROMBOL ARTERIAL INITIAL (MS) 06/15/2016 Toribio Faes, MD [facility]   IR GENERIC HISTORICAL  06/15/2016   IR INFUSION THROMBOL ARTERIAL INITIAL (MS) 06/15/2016 Toribio Faes, MD [facility]   IR GENERIC HISTORICAL  06/16/2016   IR THROMB F/U EVAL ART/VEN FINAL DAY (MS) 06/16/2016 Toribio Faes, MD [facility]    Family History  Problem Relation Age of Onset   Hypertension Mother    Fibromyalgia Mother  Cancer Maternal Grandmother 20   Breast cancer Maternal Aunt 39    Social History:  reports that she has never smoked. She has never used smokeless tobacco. She reports that she does not currently use alcohol. She reports that she does not use drugs.The patient is alone today.  Allergies:  Allergies  Allergen Reactions   Ciprofloxacin Hives, Nausea And Vomiting and Nausea Only   Levetiracetam Hives    Hives   Meclizine Other (See Comments)    Skin crawling, felt like she was losing her mind   Metronidazole Dermatitis, Other (See Comments) and Rash   Prochlorperazine Itching and Other (See Comments)    Provider: Lilian Domino [facility] - Allergy Description: COMPAZINE, 10MG  (PO Tablet) CFM - Allergy Annotation:Allscripts Description: Compazine TABS  Allscripts Description: Compazine TABS   Sulfa Antibiotics Hives and Swelling   Prochlorperazine Edisylate    Prochlorperazine Maleate    Sulfur Dioxide    Codeine Nausea And Vomiting and Nausea Only    Allscripts Description: Codeine Derivatives  Allscripts Description: Codeine Derivatives   Penicillins Hives, Other (See Comments) and Rash    Has patient had a PCN reaction causing immediate rash, facial/tongue/throat swelling, SOB or lightheadedness with hypotension: No  Has patient had a PCN reaction causing severe rash involving mucus membranes or skin necrosis: No  Has patient had a PCN reaction that required hospitalization: Yes  Has patient had a PCN reaction occurring within the  last 10 years: No  If all of the above answers are NO, then may proceed with Cephalosporin use.  Provider: Lilian Domino [facility] - Allergy Description: Penicillins CFM - Allergy Annotation:    Current Medications: Current Outpatient Medications  Medication Sig Dispense Refill   aspirin EC 81 MG tablet Take 81 mg by mouth.     fluticasone (FLONASE) 50 MCG/ACT nasal spray 1 spray.     magnesium oxide (MAG-OX) 400 MG tablet Take 400 mg by mouth.     ondansetron  (ZOFRAN ) 4 MG tablet Take 4 mg by mouth.     pregabalin (LYRICA) 75 MG capsule Take 75 mg by mouth. Can take it up to 3 times a day but only takes it once a day if that     TRULANCE 3 MG TABS Take 3 mg by mouth.     Vitamin D, Ergocalciferol, (DRISDOL) 1.25 MG (50000 UNIT) CAPS capsule Take 1 capsule by mouth once per week     albuterol  (PROVENTIL  HFA;VENTOLIN  HFA) 108 (90 Base) MCG/ACT inhaler Inhale 2 puffs into the lungs every 6 (six) hours as needed for wheezing or shortness of breath.     BREO ELLIPTA 200-25 MCG/INH AEPB      Calcium Citrate 250 MG TABS Take 1 tablet by mouth daily.     clonazePAM (KLONOPIN) 1 MG tablet Take 1 mg by mouth at bedtime.     cyclobenzaprine (FLEXERIL) 10 MG tablet Take 10 mg by mouth 2 (two) times daily as needed for muscle spasms.      DULoxetine  (CYMBALTA ) 60 MG capsule Take 60 mg by mouth daily.   1   enoxaparin (LOVENOX) 100 MG/ML injection Inject 1 mL (100 mg total) into the skin every 12 (twelve) hours. 60 mL 0   Multiple Vitamin (MULTIVITAMIN WITH MINERALS) TABS tablet Take 1 tablet by mouth daily.     ondansetron  (ZOFRAN -ODT) 4 MG disintegrating tablet Take 4 mg by mouth every 8 (eight) hours as needed for nausea or vomiting.     promethazine (PHENERGAN) 25 MG tablet Take 25  mg by mouth every 6 (six) hours as needed for nausea or vomiting.     UBRELVY 100 MG TABS Take 1 tablet by mouth 2 (two) times daily as needed.     No current facility-administered medications for this visit.    REVIEW  OF SYSTEMS:  Review of Systems  Constitutional:  Negative for appetite change, chills, fever and unexpected weight change.  HENT:  Negative.  Negative for lump/mass, mouth sores and sore throat.   Eyes: Negative.   Respiratory: Negative.  Negative for chest tightness, cough, hemoptysis, shortness of breath and wheezing.   Cardiovascular: Negative.  Negative for chest pain, leg swelling and palpitations.  Gastrointestinal: Negative.  Negative for abdominal distention, abdominal pain, blood in stool, constipation, diarrhea, nausea and vomiting.  Endocrine: Negative.   Genitourinary: Negative.  Negative for difficulty urinating, dysuria, frequency and hematuria.   Musculoskeletal:  Positive for arthralgias (left knee pain, 6/10). Negative for back pain and flank pain.  Skin: Negative.   Neurological:  Negative for dizziness, extremity weakness, headaches, light-headedness, numbness, seizures and speech difficulty.       Chronic migraines  Imbalance   Hematological: Negative.  Negative for adenopathy. Does not bruise/bleed easily.  Psychiatric/Behavioral: Negative.  Negative for depression and sleep disturbance. The patient is not nervous/anxious.     VITALS:  Blood pressure (!) 127/90, pulse 67, temperature 98.2 F (36.8 C), temperature source Oral, resp. rate 16, height 5' 4 (1.626 m), weight 230 lb 8 oz (104.6 kg), SpO2 100%.  Wt Readings from Last 3 Encounters:  05/16/24 230 lb 8 oz (104.6 kg)  05/09/19 281 lb (127.5 kg)  05/02/19 282 lb 9.6 oz (128.2 kg)    Body mass index is 39.57 kg/m.  Performance status (ECOG): 1 - Symptomatic but completely ambulatory  PHYSICAL EXAM:  Physical Exam Vitals and nursing note reviewed.  Constitutional:      General: She is not in acute distress.    Appearance: Normal appearance. She is normal weight.  HENT:     Head: Normocephalic and atraumatic.     Right Ear: Tympanic membrane, ear canal and external ear normal. There is no impacted  cerumen.     Left Ear: Tympanic membrane, ear canal and external ear normal. There is no impacted cerumen.     Nose: Nose normal. No congestion or rhinorrhea.     Mouth/Throat:     Mouth: Mucous membranes are moist.     Pharynx: Oropharynx is clear. No oropharyngeal exudate or posterior oropharyngeal erythema.  Eyes:     General: No scleral icterus.       Right eye: No discharge.        Left eye: No discharge.     Extraocular Movements: Extraocular movements intact.     Conjunctiva/sclera: Conjunctivae normal.     Pupils: Pupils are equal, round, and reactive to light.  Cardiovascular:     Rate and Rhythm: Normal rate and regular rhythm.     Pulses: Normal pulses.     Heart sounds: Normal heart sounds. No murmur heard.    No friction rub. No gallop.  Pulmonary:     Effort: Pulmonary effort is normal. No respiratory distress.     Breath sounds: Normal breath sounds.  Abdominal:     General: Bowel sounds are normal. There is no distension.     Palpations: Abdomen is soft. There is no hepatomegaly, splenomegaly or mass.     Tenderness: There is no abdominal tenderness. There is no right CVA tenderness,  left CVA tenderness, guarding or rebound.     Hernia: No hernia is present.  Musculoskeletal:        General: Normal range of motion.     Cervical back: Normal range of motion and neck supple.     Right lower leg: No edema.     Left lower leg: No edema.  Lymphadenopathy:     Cervical: No cervical adenopathy.     Right cervical: No superficial, deep or posterior cervical adenopathy.    Left cervical: No superficial, deep or posterior cervical adenopathy.     Upper Body:     Right upper body: No supraclavicular, axillary or pectoral adenopathy.     Left upper body: No supraclavicular, axillary or pectoral adenopathy.  Skin:    General: Skin is warm and dry.  Neurological:     General: No focal deficit present.     Mental Status: She is alert and oriented to person, place, and time.  Mental status is at baseline.  Psychiatric:        Mood and Affect: Mood normal.        Behavior: Behavior normal.        Thought Content: Thought content normal.        Judgment: Judgment normal.    LABS:      Latest Ref Rng & Units 05/16/2024    9:02 AM 05/02/2019   11:29 AM 06/14/2018    4:30 PM  CBC  WBC 4.0 - 10.5 K/uL 6.1  8.8  9.8   Hemoglobin 12.0 - 15.0 g/dL 86.0  85.1  85.3   Hematocrit 36.0 - 46.0 % 41.0  44.2  44.6   Platelets 150 - 400 K/uL 348  382.0  338       Latest Ref Rng & Units 05/16/2024    9:02 AM 05/02/2019   11:29 AM 06/14/2018    4:30 PM  CMP  Glucose 70 - 99 mg/dL 94  867  897   BUN 6 - 20 mg/dL 9  8  12    Creatinine 0.44 - 1.00 mg/dL 9.31  9.09  9.01   Sodium 135 - 145 mmol/L 138  136  137   Potassium 3.5 - 5.1 mmol/L 4.5  3.9  4.0   Chloride 98 - 111 mmol/L 104  100  106   CO2 22 - 32 mmol/L 26  27  22    Calcium 8.9 - 10.3 mg/dL 8.6  9.4  8.9   Total Protein 6.5 - 8.1 g/dL 6.8  7.8    Total Bilirubin 0.0 - 1.2 mg/dL 0.5  0.5    Alkaline Phos 38 - 126 U/L 177  102    AST 15 - 41 U/L 19  24    ALT 0 - 44 U/L 17  32     No results found for: CEA1, CEA / No results found for: CEA1, CEA No results found for: PSA1 No results found for: CAN199 No results found for: CAN125  No results found for: TOTALPROTELP, ALBUMINELP, A1GS, A2GS, BETS, BETA2SER, GAMS, MSPIKE, SPEI No results found for: TIBC, FERRITIN, IRONPCTSAT No results found for: LDH  STUDIES:        I,Jasmine M Lassiter,acting as a scribe for Carrie VEAR Cornish, MD.,have documented all relevant documentation on the behalf of Carrie VEAR Cornish, MD,as directed by  Carrie VEAR Cornish, MD while in the presence of Carrie VEAR Cornish, MD.
